# Patient Record
Sex: Female | Born: 1965 | Race: Black or African American | Hispanic: No | Marital: Married | Smoking: Never smoker
Health system: Southern US, Community
[De-identification: ages and names within clinical notes are randomized; demographics above are authoritative.]

## PROBLEM LIST (undated history)

## (undated) DIAGNOSIS — L439 Lichen planus, unspecified: Secondary | ICD-10-CM

## (undated) DIAGNOSIS — F329 Major depressive disorder, single episode, unspecified: Secondary | ICD-10-CM

## (undated) DIAGNOSIS — L709 Acne, unspecified: Secondary | ICD-10-CM

## (undated) DIAGNOSIS — I7300 Raynaud's syndrome without gangrene: Secondary | ICD-10-CM

## (undated) DIAGNOSIS — D259 Leiomyoma of uterus, unspecified: Secondary | ICD-10-CM

## (undated) DIAGNOSIS — F3289 Other specified depressive episodes: Secondary | ICD-10-CM

## (undated) DIAGNOSIS — M2550 Pain in unspecified joint: Secondary | ICD-10-CM

## (undated) DIAGNOSIS — M1990 Unspecified osteoarthritis, unspecified site: Secondary | ICD-10-CM

## (undated) HISTORY — DX: Leiomyoma of uterus, unspecified: D25.9

## (undated) HISTORY — DX: Unspecified osteoarthritis, unspecified site: M19.90

## (undated) HISTORY — DX: Raynaud's syndrome without gangrene: I73.00

## (undated) HISTORY — PX: PELVIC LAPAROSCOPY: SHX162

## (undated) HISTORY — DX: Pain in unspecified joint: M25.50

## (undated) HISTORY — DX: Major depressive disorder, single episode, unspecified: F32.9

## (undated) HISTORY — DX: Lichen planus, unspecified: L43.9

## (undated) HISTORY — DX: Acne, unspecified: L70.9

## (undated) HISTORY — DX: Other specified depressive episodes: F32.89

---

## 1998-12-28 ENCOUNTER — Inpatient Hospital Stay (HOSPITAL_COMMUNITY): Admission: AD | Admit: 1998-12-28 | Discharge: 1998-12-30 | Payer: Self-pay | Admitting: Gynecology

## 1999-01-29 HISTORY — PX: TUBAL LIGATION: SHX77

## 1999-02-08 ENCOUNTER — Ambulatory Visit (HOSPITAL_COMMUNITY): Admission: RE | Admit: 1999-02-08 | Discharge: 1999-02-08 | Payer: Self-pay | Admitting: Obstetrics and Gynecology

## 2001-01-05 ENCOUNTER — Other Ambulatory Visit: Admission: RE | Admit: 2001-01-05 | Discharge: 2001-01-05 | Payer: Self-pay | Admitting: Gynecology

## 2002-01-09 ENCOUNTER — Other Ambulatory Visit: Admission: RE | Admit: 2002-01-09 | Discharge: 2002-01-09 | Payer: Self-pay | Admitting: Gynecology

## 2003-01-17 ENCOUNTER — Other Ambulatory Visit: Admission: RE | Admit: 2003-01-17 | Discharge: 2003-01-17 | Payer: Self-pay | Admitting: Gynecology

## 2004-02-20 ENCOUNTER — Other Ambulatory Visit: Admission: RE | Admit: 2004-02-20 | Discharge: 2004-02-20 | Payer: Self-pay | Admitting: Gynecology

## 2004-05-30 HISTORY — PX: COLONOSCOPY: SHX174

## 2004-07-19 ENCOUNTER — Ambulatory Visit (HOSPITAL_COMMUNITY): Admission: RE | Admit: 2004-07-19 | Discharge: 2004-07-19 | Payer: Self-pay | Admitting: Internal Medicine

## 2005-02-23 ENCOUNTER — Other Ambulatory Visit: Admission: RE | Admit: 2005-02-23 | Discharge: 2005-02-23 | Payer: Self-pay | Admitting: Gynecology

## 2006-01-07 IMAGING — CR DG LUMBAR SPINE COMPLETE 4+V
5 series · 5 of 5 positions shown · non-contrast
Comparison: none

CLINICAL DATA: Low back pain bilaterally for six months.  No trauma.  No prior studies.
 LUMBAR SPINE ? FIVE VIEWS:
 Five views of the lumbar spine demonstrate mild rotoscoliosis of the lumbar spine with convexity to the left.  Disc spaces are maintained.  No fracture or dislocation is noted.  There is mild degenerative change of the facet joints at L4-5 and L5-6.  No spondylolysis or spondylolisthesis is noted.

[view not recorded (1 of 5)]
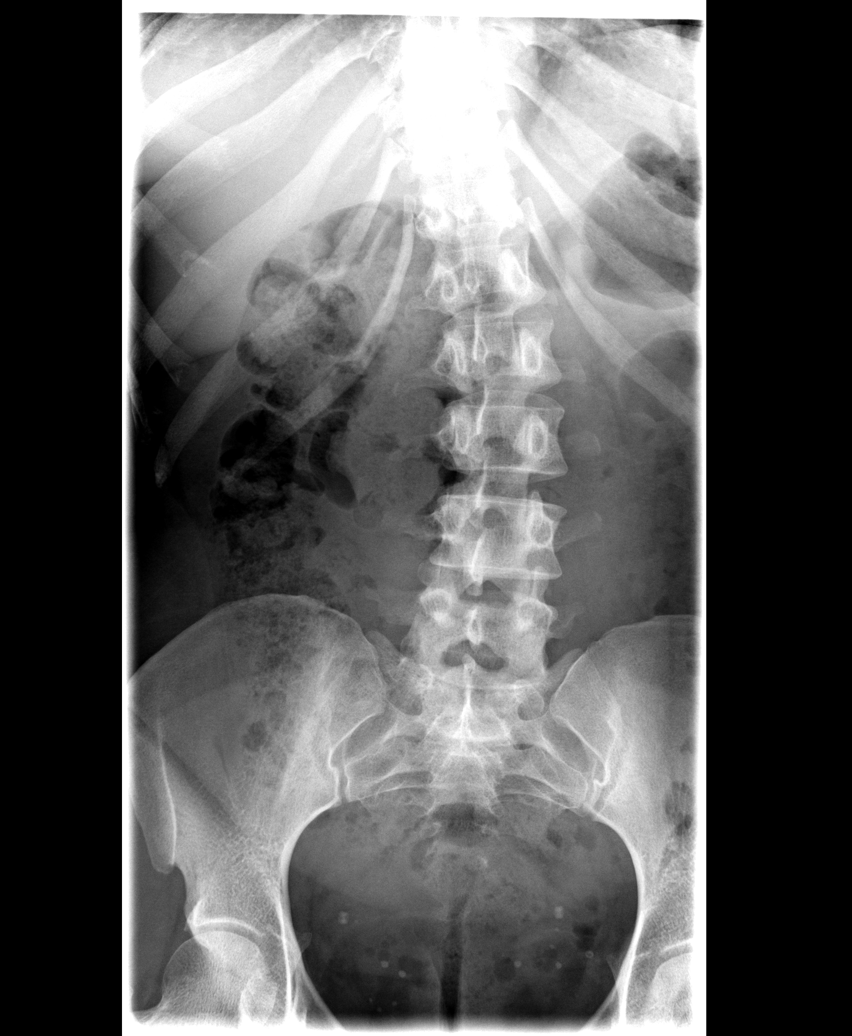

[view not recorded (2 of 5)]
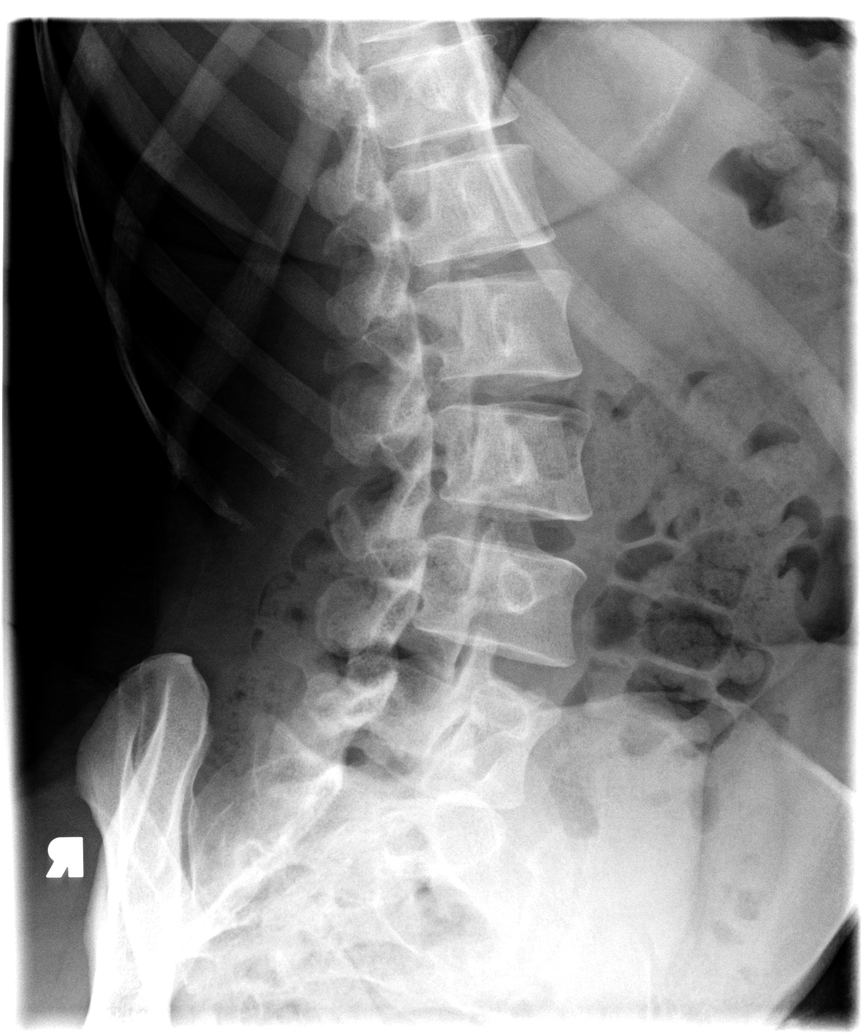

[view not recorded (3 of 5)]
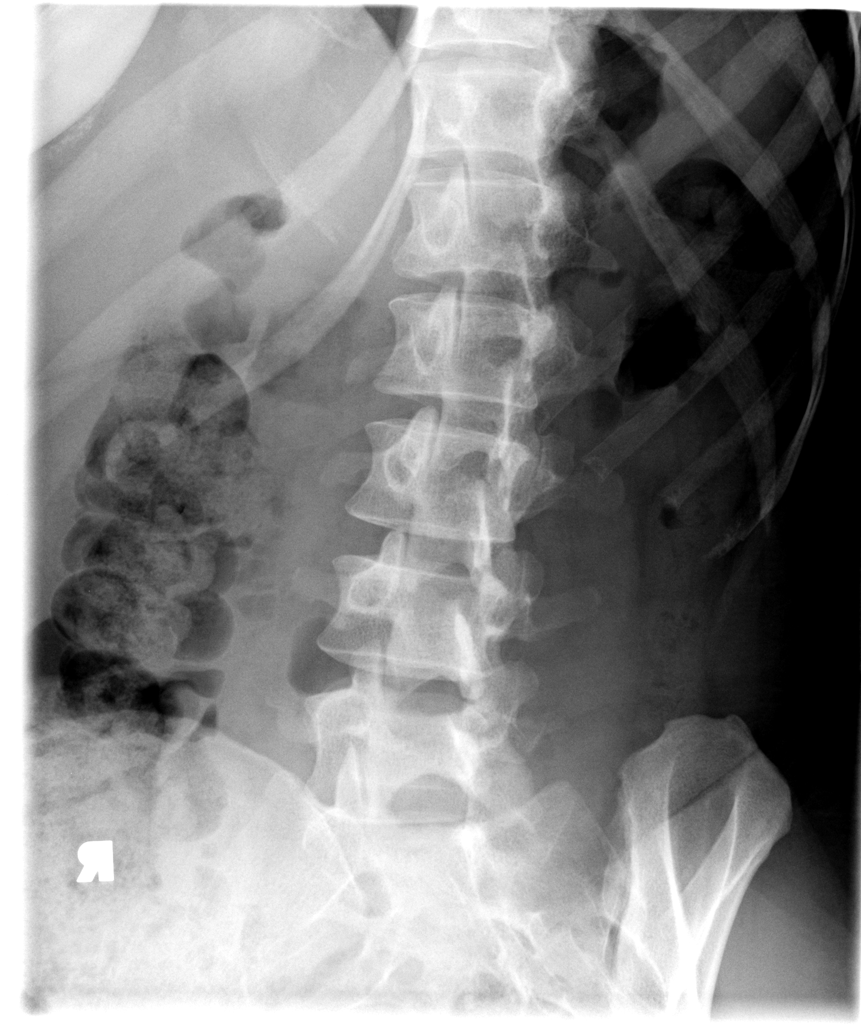

[view not recorded (4 of 5)]
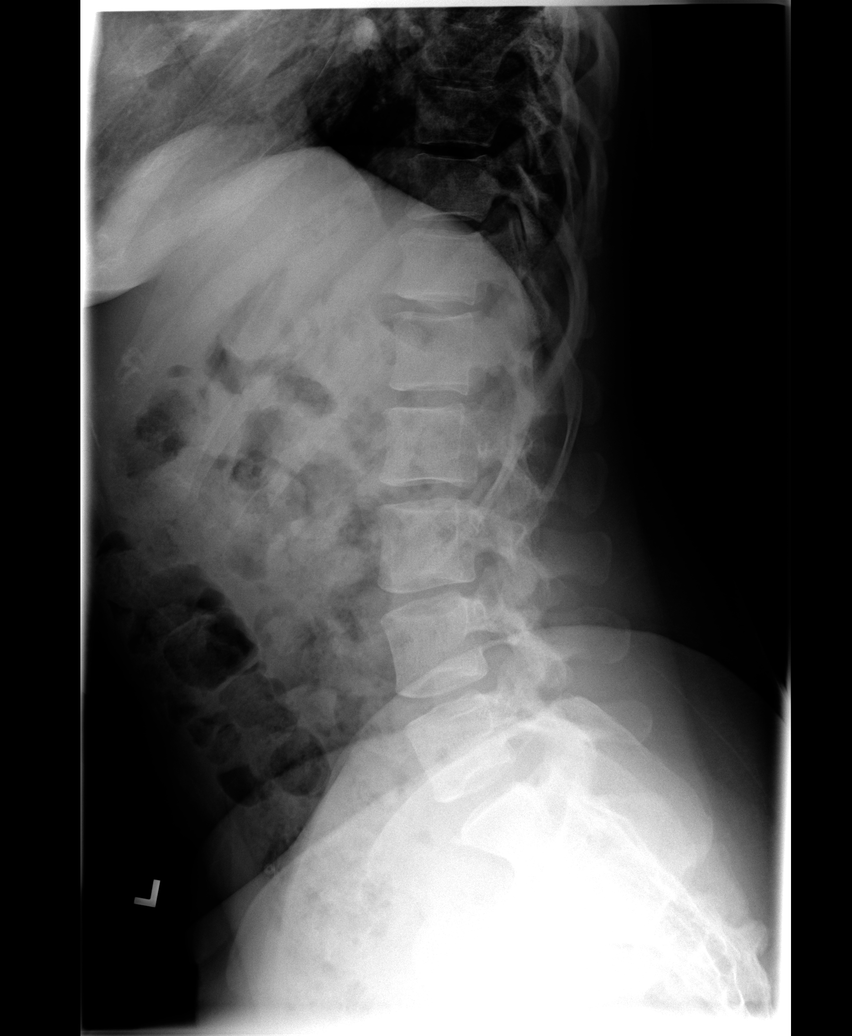

[view not recorded (5 of 5)]
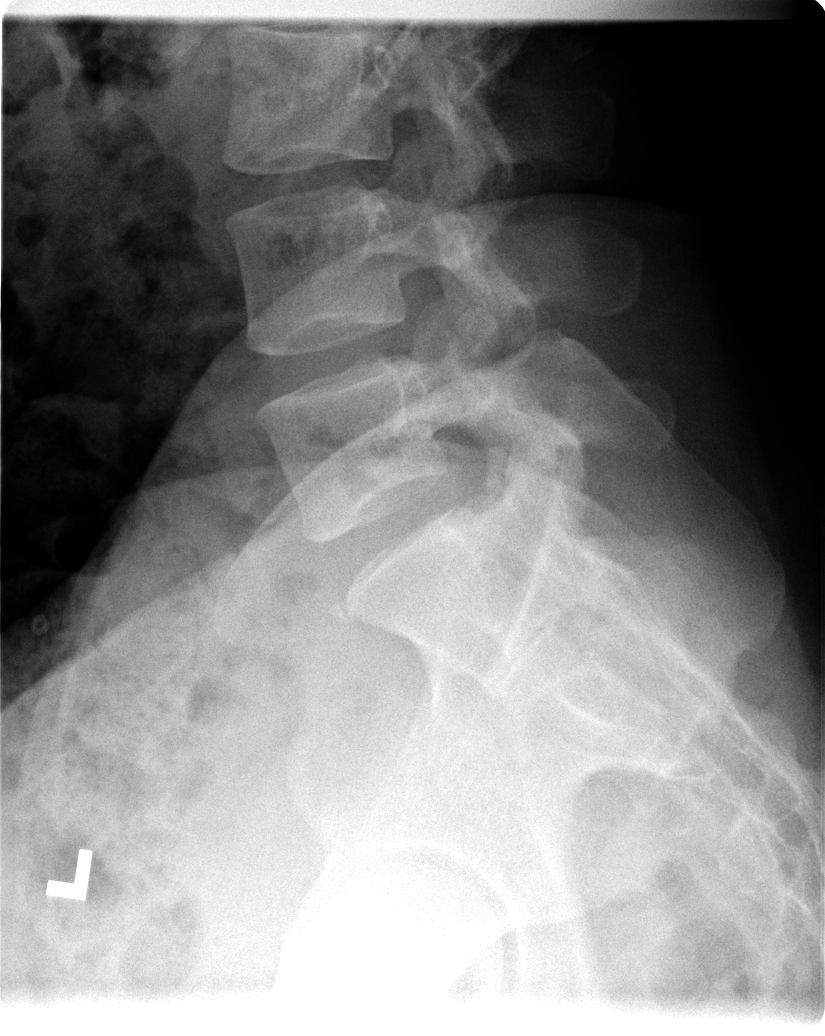

[5 of 5 positions shown; findings below may reference images not displayed]

IMPRESSION: Mild rotoscoliosis of the lumbar spine with convexity to the left.  Mild degenerative changes at the facet joints at L4-5 and L5-S1.

## 2006-03-06 ENCOUNTER — Other Ambulatory Visit: Admission: RE | Admit: 2006-03-06 | Discharge: 2006-03-06 | Payer: Self-pay | Admitting: Gynecology

## 2007-02-10 ENCOUNTER — Emergency Department (HOSPITAL_COMMUNITY): Admission: EM | Admit: 2007-02-10 | Discharge: 2007-02-10 | Payer: Self-pay | Admitting: Emergency Medicine

## 2007-03-12 ENCOUNTER — Other Ambulatory Visit: Admission: RE | Admit: 2007-03-12 | Discharge: 2007-03-12 | Payer: Self-pay | Admitting: Gynecology

## 2007-04-23 ENCOUNTER — Emergency Department (HOSPITAL_COMMUNITY): Admission: EM | Admit: 2007-04-23 | Discharge: 2007-04-23 | Payer: Self-pay | Admitting: Emergency Medicine

## 2008-03-12 ENCOUNTER — Encounter: Payer: Self-pay | Admitting: Women's Health

## 2008-03-12 ENCOUNTER — Other Ambulatory Visit: Admission: RE | Admit: 2008-03-12 | Discharge: 2008-03-12 | Payer: Self-pay | Admitting: Gynecology

## 2008-03-12 ENCOUNTER — Ambulatory Visit: Payer: Self-pay | Admitting: Women's Health

## 2008-09-24 ENCOUNTER — Ambulatory Visit: Payer: Self-pay | Admitting: Women's Health

## 2009-01-28 LAB — HM MAMMOGRAPHY: HM Mammogram: NORMAL

## 2009-03-16 ENCOUNTER — Encounter: Payer: Self-pay | Admitting: Women's Health

## 2009-03-16 ENCOUNTER — Ambulatory Visit: Payer: Self-pay | Admitting: Women's Health

## 2009-03-16 ENCOUNTER — Other Ambulatory Visit: Admission: RE | Admit: 2009-03-16 | Discharge: 2009-03-16 | Payer: Self-pay | Admitting: Gynecology

## 2009-12-28 ENCOUNTER — Ambulatory Visit: Payer: Self-pay | Admitting: Internal Medicine

## 2009-12-28 DIAGNOSIS — L659 Nonscarring hair loss, unspecified: Secondary | ICD-10-CM | POA: Insufficient documentation

## 2009-12-28 DIAGNOSIS — R5381 Other malaise: Secondary | ICD-10-CM | POA: Insufficient documentation

## 2009-12-28 DIAGNOSIS — R5383 Other fatigue: Secondary | ICD-10-CM

## 2009-12-28 DIAGNOSIS — D649 Anemia, unspecified: Secondary | ICD-10-CM | POA: Insufficient documentation

## 2009-12-28 DIAGNOSIS — R519 Headache, unspecified: Secondary | ICD-10-CM | POA: Insufficient documentation

## 2009-12-28 DIAGNOSIS — R51 Headache: Secondary | ICD-10-CM | POA: Insufficient documentation

## 2009-12-28 LAB — CONVERTED CEMR LAB
ALT: 13 units/L (ref 0–35)
Albumin: 3.8 g/dL (ref 3.5–5.2)
BUN: 6 mg/dL (ref 6–23)
Chloride: 104 meq/L (ref 96–112)
Eosinophils Relative: 5.5 % — ABNORMAL HIGH (ref 0.0–5.0)
Glucose, Bld: 84 mg/dL (ref 70–99)
HCT: 32.1 % — ABNORMAL LOW (ref 36.0–46.0)
Lymphs Abs: 2 10*3/uL (ref 0.7–4.0)
MCV: 82.3 fL (ref 78.0–100.0)
Monocytes Absolute: 0.4 10*3/uL (ref 0.1–1.0)
Neutro Abs: 2.1 10*3/uL (ref 1.4–7.7)
Platelets: 369 10*3/uL (ref 150.0–400.0)
Potassium: 4.3 meq/L (ref 3.5–5.1)
RDW: 15.6 % — ABNORMAL HIGH (ref 11.5–14.6)
TSH: 1.87 microintl units/mL (ref 0.35–5.50)
Total Bilirubin: 0.2 mg/dL — ABNORMAL LOW (ref 0.3–1.2)
WBC: 4.7 10*3/uL (ref 4.5–10.5)

## 2010-01-12 ENCOUNTER — Telehealth: Payer: Self-pay | Admitting: Internal Medicine

## 2010-01-29 ENCOUNTER — Telehealth (INDEPENDENT_AMBULATORY_CARE_PROVIDER_SITE_OTHER): Payer: Self-pay | Admitting: *Deleted

## 2010-02-02 ENCOUNTER — Ambulatory Visit: Payer: Self-pay | Admitting: Internal Medicine

## 2010-02-24 ENCOUNTER — Telehealth: Payer: Self-pay | Admitting: Internal Medicine

## 2010-02-24 DIAGNOSIS — L259 Unspecified contact dermatitis, unspecified cause: Secondary | ICD-10-CM | POA: Insufficient documentation

## 2010-03-09 ENCOUNTER — Emergency Department (HOSPITAL_COMMUNITY): Admission: EM | Admit: 2010-03-09 | Discharge: 2010-03-09 | Payer: Self-pay | Admitting: Emergency Medicine

## 2010-03-17 ENCOUNTER — Ambulatory Visit: Payer: Self-pay | Admitting: Women's Health

## 2010-03-17 ENCOUNTER — Other Ambulatory Visit
Admission: RE | Admit: 2010-03-17 | Discharge: 2010-03-17 | Payer: Self-pay | Source: Home / Self Care | Admitting: Gynecology

## 2010-05-25 ENCOUNTER — Telehealth: Payer: Self-pay | Admitting: Internal Medicine

## 2010-06-11 ENCOUNTER — Ambulatory Visit
Admission: RE | Admit: 2010-06-11 | Discharge: 2010-06-11 | Payer: Self-pay | Source: Home / Self Care | Attending: Internal Medicine | Admitting: Internal Medicine

## 2010-06-23 ENCOUNTER — Telehealth: Payer: Self-pay | Admitting: Internal Medicine

## 2010-06-29 NOTE — Progress Notes (Signed)
Summary: Tina Stokes pt/please advise  Phone Note Call from Patient Call back at Home Phone [phone]   Caller: Patient Call For: Newt Lukes MD Summary of Call: Pt left message on triage B..Pt taking medciation prescribed by Dr Tina Stokes for her nerves that has caused acne. She wants to be switched to another med that will not cause this side effect. Please advise. Initial call taken by: Verdell Face,  January 29, 2010 9:02 AM  Follow-up for Phone Call        needs to be seen, none of her meds are associated with acne Follow-up by: Etta Grandchild MD,  January 29, 2010 9:59 AM  Additional Follow-up for Phone Call Additional follow up Details #1::        Pt has appt 9/6 at 1:15pm w/Dr Tina Stokes Additional Follow-up by: Verdell Face,  January 29, 2010 10:38 AM

## 2010-06-29 NOTE — Progress Notes (Signed)
Summary: ALT med  Phone Note Call from Patient Call back at Home Phone [phone]   Caller: Patient Summary of Call: Pt called stating that she is still having facial acne with Citalopram. Pt is requesting alternative to [employer]. Initial call taken by: Margaret Pyle, CMA,  February 24, 2010 10:54 AM  Follow-up for Phone Call        i do not know of citalopram (or venlafaxine as before) causing acne - will refer to derm for opinion on facial skin condition and tx - in meanwhile, would wean off citalopram - reduce to 10mg  once daily (if not already on this dose) x 5 days, then stop med- no new substitiute until seen by derm - thanks Follow-up by: Newt Lukes MD,  February 24, 2010 12:53 PM  Additional Follow-up for Phone Call Additional follow up Details #1::        Pt advised and understood MD recommendation Additional Follow-up by: Margaret Pyle, CMA,  February 24, 2010 1:10 PM  New Problems: CONTACT DERMATITIS&OTHER ECZEMA DUE UNSPEC CAUSE (ICD-692.9)   New Problems: CONTACT DERMATITIS&OTHER ECZEMA DUE UNSPEC CAUSE (ICD-692.9)

## 2010-06-29 NOTE — Progress Notes (Signed)
Summary: ALT med  Phone Note Call from Patient Call back at [facility] Phone [phone]   Caller: Patient Summary of Call: Pt called requesting an alternate medication for "nerves", other than Gabapentin. Pt states she discussed side effects (drowsiness, dizziness, tired feeling) with MD at [facility] and thought medication was going to be changed. Pt is requesting a generic without same SE? Please advise. Initial call taken by: Margaret Pyle, CMA,  January 12, 2010 4:42 PM  Follow-up for Phone Call        will try change to generic effexor (venlafaxine) - erx done - stop gabapentin - needs ROV in next 6-12wk to review symptoms and new med, sooner if probs Follow-up by: Newt Lukes MD,  January 12, 2010 5:03 PM  Additional Follow-up for Phone Call Additional follow up Details #1::        Pt informed in detail via VM. Told to call back with any further questions or concerns. Additional Follow-up by: Margaret Pyle, CMA,  January 13, 2010 8:06 AM    New/Updated Medications: VENLAFAXINE HCL 37.5 MG XR24H-CAP (VENLAFAXINE HCL) 1 by mouth once daily Prescriptions: VENLAFAXINE HCL 37.5 MG XR24H-CAP (VENLAFAXINE HCL) 1 by mouth once daily  #30 x 3   Entered and Authorized by:   Newt Lukes MD   Signed by:   Newt Lukes MD on 01/12/2010   Method used:   Electronically to        [facility] [address] #[id number]* (retail)       [address] [place], [place]  [address]       Ph: [phone]       Fax: [phone]   RxID:   [id number]

## 2010-06-29 NOTE — Assessment & Plan Note (Signed)
Summary: f/u appt re anxiety meds/cd   Vital Signs:  Patient profile:   45 year old female Height:      61 inches (154.94 cm) Weight:      142.4 pounds (64.73 kg) O2 Sat:      98 % on Room air Temp:     98.4 degrees F (36.89 degrees C) oral Pulse rate:   67 / minute BP sitting:   102 / 72  (left arm) Cuff size:   regular  Vitals Entered By: Orlan Leavens RMA (February 02, 2010 1:09 PM)  O2 Flow:  Room air CC: follow-up visit Is Patient Diabetic? No Pain Assessment Patient in pain? no        Primary Care Provider:  Newt Lukes MD  CC:  follow-up visit.  History of Present Illness: here for f/u - anxiety -  tol venlafax well with less calp irritation (primary symptoms) and less fatigue/irritability - caused adv SE: acne - would like change in med - gapapentin ineffective compared to venlafax in controlling scalp symptoms   acne - no recent hx same prior to starting [facility] - affects only face - bilateral jaw region - no change soap or makeup -  Current Medications (verified): 1)  Multivitamins  Tabs (Multiple Vitamin) .... Take 1 By Mouth Once Daily 2)  Folic Acid 1 Mg Tabs (Folic Acid) .... Take 1 By Mouth Once Daily 3)  Venlafaxine Hcl 37.5 Mg Xr24h-Cap (Venlafaxine Hcl) .Marland Kitchen.. 1 By Mouth Once Daily 4)  Ferrous Sulfate 325 (65 Fe) Mg Tbec (Ferrous Sulfate) .... Take 1 Two Times A Day  Allergies (verified): No Known Drug Allergies  Past History:  Past Medical History: Anemia-NOS Depression/anxiety chronic "scalp infection" -     followed at [facility] and [facility] for same - s/p 6 mo oral abx w/o resolution  MD roster: gyn - nancy young  Review of Systems  The patient denies anorexia, fever, syncope, and peripheral edema.    Physical Exam  General:  alert, well-developed, well-nourished, and cooperative to examination.    Lungs:  normal respiratory effort, no intercostal retractions or use of accessory muscles; normal breath sounds bilaterally - no crackles  and no wheezes.    Heart:  normal rate, regular rhythm, no murmur, and no rub. BLE without edema. Skin:  mild-mod cystic acne bilateral jaw region of face R>L - scalp intact - no lesions or rash or hair loss Psych:  Oriented X3, memory intact for recent and remote, normally interactive, good eye contact, not anxious appearing, not depressed appearing, and not agitated.      Impression & Recommendations:  Problem # 1:  DEPRESSION (ICD-311)  improved symptoms anxiety and depression with venlafax - notes less scalp irritation on venlafax compared to gabapentin- (pt's primary concern/manifestation of symptoms) try ssri given adv se acne with venlafax - citalopram erx done f/u 3-6 mos to reiew, sooner if probs or ineffective symptoms relief Her updated medication list for this problem includes:    Citalopram Hydrobromide 10 Mg Tabs (Citalopram hydrobromide) .Marland Kitchen... 1 by mouth once daily (or as directed)  Orders: Prescription Created Electronically [phone])  Complete Medication List: 1)  Multivitamins Tabs (Multiple vitamin) .... Take 1 by mouth once daily 2)  Folic Acid 1 Mg Tabs (Folic acid) .... Take 1 by mouth once daily 3)  Citalopram Hydrobromide 10 Mg Tabs (Citalopram hydrobromide) .Marland Kitchen.. 1 by mouth once daily (or as directed) 4)  Ferrous Sulfate 325 (65 Fe) Mg Tbec (Ferrous sulfate) .... Take  1 two times a day 5)  Benzaclin 1-5 % Gel (Clindamycin phos-benzoyl perox) .... Apply to affected skin two times a day  Patient Instructions: 1)  it was good to see you today. 2)  stop venlafaxine - start low dose citalopram as discussed- 3)  also benzaclin for your skin 4)  your prescriptions have been electronically submitted to your pharmacy. Please take as directed. Contact our office if you believe you're having problems with the medication(s).  5)  Please schedule a follow-up appointment in 3-6 months to review symptoms and medications, call sooner if problems.  Prescriptions: BENZACLIN 1-5  % GEL (CLINDAMYCIN PHOS-BENZOYL PEROX) apply to affected skin two times a day  #1 x 1   Entered by:   Orlan Leavens RMA   Authorized by:   Newt Lukes MD   Signed by:   Orlan Leavens RMA on 02/02/2010   Method used:   Electronically to        [facility] [address] [phone]* (retail)       [address]       [place], [place]  [address]       Ph: [phone]       Fax: [phone]   RxID:   [id number] CITALOPRAM HYDROBROMIDE 10 MG TABS (CITALOPRAM HYDROBROMIDE) 1 by mouth once daily (or as directed)  #30 x 6   Entered by:   Orlan Leavens RMA   Authorized by:   Newt Lukes MD   Signed by:   Orlan Leavens RMA on 02/02/2010   Method used:   Electronically to        [facility] #[id number]* (retail)       [address]       [place], [place]  [address]       Ph: [phone]       Fax: [phone]   RxID:   [id number] BENZACLIN 1-5 % GEL (CLINDAMYCIN PHOS-BENZOYL PEROX) apply to affected skin two times a day  #1 x 1   Entered and Authorized by:   Newt Lukes MD   Signed by:   Newt Lukes MD on 02/02/2010   Method used:   Electronically to        [facility] #[id number]* (retail)       [address] [place], [place]  [address]       Ph: [phone]       Fax: [phone]   RxID:   [id number] CITALOPRAM HYDROBROMIDE 10 MG TABS (CITALOPRAM HYDROBROMIDE) 1 by mouth once daily (or as directed)  #30 x 6   Entered and Authorized by:   Newt Lukes MD   Signed by:   Newt Lukes MD on 02/02/2010   Method used:   Electronically to        [facility] #[id number]* (retail)       [address] [place], [place]  [address]       Ph: [phone]       Fax: [phone]   RxID:   [id number]

## 2010-06-29 NOTE — Assessment & Plan Note (Signed)
Summary: New / [facility] / # / cd   Vital Signs:  Patient profile:   45 year old female Height:      61.0 inches (154.94 cm) Weight:      143.8 pounds (65.36 kg) BMI:     27.27 O2 Sat:      99 % on Room air Temp:     98.7 degrees F (37.06 degrees C) oral Pulse rate:   72 / minute BP sitting:   110 / 70  (left arm) Cuff size:   regular  Vitals Entered By: Orlan Leavens RMA (December 28, 2009 2:00 PM)  O2 Flow:  Room air CC: New patient Is Patient Diabetic? No Pain Assessment Patient in pain? no        Primary Care Provider:  Newt Lukes MD  CC:  New patient.  History of Present Illness: new pt to me and our practice, here to est care -  c/o fatigue - ongoing 2 weeks -  gradual onset similar symptoms with anemia in past - but has never required transfusion - prev rx'd iron tabs - none in past year -  still having regular menstral cycles - not esp heavy or changed denies thalasemia or other hereditary anemia - denies depression/anxiety symptoms or weight changes no CP, SOB or syncope - no med changes or sleep trouble     Preventive Screening-Counseling & Management  Alcohol-Tobacco     Alcohol drinks/day: <1     Alcohol Counseling: not indicated; use of alcohol is not excessive or problematic     Smoking Status: never     Tobacco Counseling: not indicated; no tobacco use  Caffeine-Diet-Exercise     Exercise Counseling: to improve exercise regimen     Depression Counseling: not indicated; screening negative for depression  Safety-Violence-Falls     Seat Belt Counseling: not indicated; patient wears seat belts     Helmet Counseling: not applicable     Firearms in the Home: no firearms in the home     Firearm Counseling: not applicable     Smoke Detectors: yes     Violence in the Home: no risk noted     Fall Risk Counseling: not indicated; no significant falls noted  Clinical Review Panels:  Prevention   Last Mammogram:  No specific  mammographic evidence of malignancy.   (01/28/2009)   Last Pap Smear:  Interpretation Result:Negative for intraepithelial Lesion or Malignancy.    (02/27/2009)   Last Colonoscopy:  Pt states she was in IBS study had colonoscopy everything was normal (05/31/2007)   Current Medications (verified): 1)  Multivitamins  Tabs (Multiple Vitamin) .... Take 1 By Mouth Once Daily 2)  Folic Acid 1 Mg Tabs (Folic Acid) .... Take 1 By Mouth Once Daily 3)  Gabapentin 600 Mg Tabs (Gabapentin) .... Take 1-3 Once Daily As Needed  Allergies (verified): No Known Drug Allergies  Past History:  Past Medical History: Anemia-NOS Depression chronic "scalp infection" -     followed at [facility] and [facility] for same - s/p 6 mo oral abx w/o resolution  Md roster: gyn - nancy young  Past Surgical History: Denies surgical history  Family History: Family History of Arthritis (parent)   Family History High cholesterol( parent) Family History Hypertension (parent)  Social History: Never Smoked, occ alcohol married, lives with spouse and 2 kids works as Associate Professor Smoking Status:  never  Review of Systems       see HPI above. I have reviewed all  other systems and they were negative.   Physical Exam  General:  alert, well-developed, well-nourished, and cooperative to examination.    Eyes:  vision grossly intact; pupils equal, round and reactive to light.  conjunctiva and lids normal.    Ears:  normal pinnae bilaterally, without erythema, swelling, or tenderness to palpation. TMs clear, without effusion, or cerumen impaction. Hearing grossly normal bilaterally  Mouth:  teeth and gums in good repair; mucous membranes moist, without lesions or ulcers. oropharynx clear without exudate, no erythema.  Neck:  supple, full ROM, no masses, no thyromegaly; no thyroid nodules or tenderness. no JVD or carotid bruits.   Lungs:  normal respiratory effort, no intercostal retractions or use of accessory muscles; normal  breath sounds bilaterally - no crackles and no wheezes.    Heart:  normal rate, regular rhythm, no murmur, and no rub. BLE without edema. normal DP pulses and normal cap refill in all 4 extremities    Abdomen:  soft, non-tender, normal bowel sounds, no distention; no masses and no appreciable hepatomegaly or splenomegaly.   Genitalia:  defer to gyn Msk:  No deformity or scoliosis noted of thoracic or lumbar spine.   Neurologic:  alert & oriented X3 and cranial nerves II-XII symetrically intact.  strength normal in all extremities, sensation intact to light touch, and gait normal. speech fluent without dysarthria or aphasia; follows commands with good comprehension.  Skin:  mild bitemporal thinning of hair w/o broken hair or inflammation - no scalp lesions appreciated (female pattern early stages); no excess hair on chin, face, arms - otherwise, no rashes, vesicles, ulcers, or erythema. No nodules or irregularity to palpation.   Psych:  Oriented X3, memory intact for recent and remote, normally interactive, good eye contact, not anxious appearing, not depressed appearing, and not agitated.      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:" for which she is being treated at [facility] at this time eval with labs as above for fatigue - as no other hirsuit changes or neuro/vison problems, will forego hormone eval at this time Orders: TLB-BMP (Basic Metabolic Panel-BMET) (80048-METABOL) TLB-CBC Platelet - w/Differential  (85025-CBCD) TLB-Hepatic/Liver Function Pnl (80076-HEPATIC) TLB-TSH (Thyroid Stimulating Hormone) (84443-TSH)  Problem # 3:  ANEMIA-NOS (ICD-285.9) reports hx same - prev on slowFe - await labs - may need to re-start tx for same Her updated medication list for this problem includes:    Folic Acid 1 Mg Tabs (Folic acid) .Marland Kitchen... Take 1 by mouth once daily  Orders: TLB-BMP (Basic Metabolic Panel-BMET) (80048-METABOL) TLB-CBC Platelet - w/Differential (85025-CBCD) TLB-Hepatic/Liver Function Pnl (80076-HEPATIC) TLB-TSH (Thyroid Stimulating Hormone) (84443-TSH)  Complete Medication List: 1)  Multivitamins Tabs (Multiple vitamin) .... Take 1 by mouth once daily 2)  Folic Acid 1 Mg Tabs (Folic acid) .... Take 1 by mouth once daily 3)  Gabapentin 600 Mg Tabs (Gabapentin) .... Take 1-3 once daily as needed  Patient Instructions: 1)  it was good to see you today. 2)  test(s) ordered today - your results will be called to you in 48-72 hours from the time of test completion; call [phone]  and enter your 9 digit MRN (listed above on this page, just below your name); if any changes need to be made or there are abnormal results, you will be contacted directly.  3)  continue supportive care for your scalp condition as discussed - no changes recommended at this time 4)  stay hydrated and out of the heat of the day - 5)  Please schedule a follow-up appointment as needed, may scheduled for a "medical physical" to include cholesterol screening at your lesiure (continue to see gyn for PAP/pelvic).   Mammogram  Procedure date:  01/28/2009  Findings:      No specific mammographic evidence of malignancy.    Pap Smear  Procedure date:  02/27/2009  Findings:      Interpretation Result:Negative for intraepithelial Lesion or Malignancy.     Colonoscopy  Procedure date:  05/31/2007  Findings:      Pt states she was in IBS study had colonoscopy everything was normal

## 2010-07-01 NOTE — Assessment & Plan Note (Signed)
Summary: rx consult-lb   Vital Signs:  Patient profile:   45 year old female Height:      61 inches (154.94 cm) Weight:      142.0 pounds (64.55 kg) O2 Sat:      99 % on Room air Temp:     98.5 degrees F (36.94 degrees C) oral Pulse rate:   81 / minute BP sitting:   110 / 76  (left arm) Cuff size:   regular  Vitals Entered By: Orlan Leavens RMA (June 11, 2010 8:36 AM)  O2 Flow:  Room air CC: discuss med Is Patient Diabetic? No Pain Assessment Patient in pain? no        Primary Care Provider:  Newt Lukes MD  CC:  discuss med.  History of Present Illness: here for f/u -  anxiety -  prev venlafax trial caused acne changed to citalopram - tol well with less scalp irritation (primary symptoms) and less fatigue/irritability - would like inc dose due to increase stress (son with sz 04/2010) gapapentin ineffective compared to venlafax in controlling scalp symptoms   c/o muscle spasm  affects right neck and left shoulder blade +hx same but not in years exac by stress and position at work [employer]) no trauma or injury no fever  Clinical Review Panels:  CBC   WBC:  4.7 (12/28/2009)   RBC:  3.90 (12/28/2009)   Hgb:  10.8 (12/28/2009)   Hct:  32.1 (12/28/2009)   Platelets:  369.0 (12/28/2009)   MCV  82.3 (12/28/2009)   MCHC  33.5 (12/28/2009)   RDW  15.6 (12/28/2009)   PMN:  44.7 (12/28/2009)   Lymphs:  41.6 (12/28/2009)   Monos:  7.6 (12/28/2009)   Eosinophils:  5.5 (12/28/2009)   Basophil:  0.6 (12/28/2009)  Complete Metabolic Panel   Glucose:  84 (12/28/2009)   Sodium:  140 (12/28/2009)   Potassium:  4.3 (12/28/2009)   Chloride:  104 (12/28/2009)   CO2:  29 (12/28/2009)   BUN:  6 (12/28/2009)   Creatinine:  0.9 (12/28/2009)   Albumin:  3.8 (12/28/2009)   Total Protein:  6.8 (12/28/2009)   Calcium:  9.2 (12/28/2009)   Total Bili:  0.2 (12/28/2009)   Alk Phos:  42 (12/28/2009)   SGPT (ALT):  13 (12/28/2009)   SGOT (AST):  17  (12/28/2009)   Current Medications (verified): 1)  Citalopram Hydrobromide 10 Mg Tabs (Citalopram Hydrobromide) .Marland Kitchen.. 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    Physical Exam  General:  alert, well-developed, well-nourished, and cooperative to examination.    Lungs:  normal respiratory effort, no intercostal retractions or use of accessory muscles; normal breath sounds bilaterally - no crackles and no wheezes.    Heart:  normal rate, regular rhythm, no murmur, and no rub. BLE without edema. Msk:  +myofascial pain due to spasm over right paravert muscles and L scapular region Neurologic:  alert & oriented X3 and cranial nerves II-XII symetrically intact.  strength normal in all extremities, sensation intact to light touch, and gait normal. speech fluent without dysarthria or aphasia; follows commands with good comprehension.  Skin:  uniformly broken  hairs over crown of head without scalp inflammation or lesion Psych:  Oriented X3, memory intact for recent and remote, normally interactive, good eye contact, not anxious appearing, min depressed appearing, and not agitated.      Impression & Recommendations:  Problem # 1:  DEPRESSION (ICD-311)  Her updated medication list for this problem includes:    Citalopram Hydrobromide 20 Mg Tabs (Citalopram hydrobromide) .Marland Kitchen... 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 1 by mouth once daily 2)  Flexeril 10 Mg Tabs (Cyclobenzaprine hcl) .... 1/2-1 by mouth three times a day as needed for muscle spasm  Patient Instructions: 1)  it was good to see you today. 2)  increase citalopram to 20mg  once daily for stress control 3)  use flexeril formuscle spasm pain 4)  your prescriptions have been electronically submitted to your pharmacy. Please take as directed. Contact our office if you believe you're having problems with the medication(s).  5)  also use aleve 1 tab two times a day x 3 days, then as needed for pain 6)  Please schedule a follow-up appointment in 6 months to review stress and hair loss, call sooner if problems.  Prescriptions: FLEXERIL 10 MG TABS (CYCLOBENZAPRINE HCL) 1/2-1 by mouth three times a day as needed for muscle spasm  #40 x 0   Entered and Authorized by:   Newt Lukes MD   Signed by:   Newt Lukes MD on 06/11/2010   Method used:   Electronically to        [facility] #[id number]* (retail)       [address]       [place], [place]  [address]       Ph: [phone]       Fax: [phone]   RxID:   [id number] CITALOPRAM HYDROBROMIDE 20 MG TABS (CITALOPRAM HYDROBROMIDE) 1 by mouth once daily  #30 x 6   Entered and Authorized by:   Newt Lukes MD   Signed by:   Newt Lukes MD on 06/11/2010   Method used:   Electronically to        [facility] [address] #[id number]* (retail)       [address]       [place],  [place]  [address]       Ph: [phone]       Fax: [phone]   RxID:   [id number]    Orders Added: 1)  Est. Patient Level IV [57322] 2)  Prescription Created Electronically [phone]

## 2010-07-01 NOTE — Progress Notes (Signed)
Summary: Dosage increase?  Phone Note Call from Patient Call back at Home Phone [phone]   Caller: Patient Summary of Call: Pt called stating she was advised at last OV that if she felt an increase of Citalopram was needed to call and inform MD. Pt states med does not work as well as it used to and would like to know if she can increase dosage freqency, please advise. Initial call taken by: Margaret Pyle, CMA,  May 25, 2010 11:30 AM  Follow-up for Phone Call        it has been >12 weeks since last OV and we need to review the medications and symptoms and SE in person - please sched OV before making and med/dose change - thanks Follow-up by: Newt Lukes MD,  May 25, 2010 12:25 PM  Additional Follow-up for Phone Call Additional follow up Details #1::        left detailed vm for pt on hm # Additional Follow-up by: Lamar Sprinkles, CMA,  May 25, 2010 5:59 PM

## 2010-07-01 NOTE — Progress Notes (Signed)
Summary: ALT med?  Phone Note Call from Patient Call back at [facility] Phone [phone]   Caller: Patient Summary of Call: Pt called stating since increasing antidepressant dose, she has began noticing slowed speech add mild stutter. Pt is requesting MD advisement and alternate medication, please advise. Initial call taken by: Margaret Pyle, CMA,  June 23, 2010 4:46 PM  Follow-up for Phone Call        break citalopram in half and go back to only 10mg  once daily - no prior adv SE on low dose - if cont problems on 10mg  dose, stop med and sched ROV to review other tx options - thx - please note SE (slow speech) may also be due to flexeril rather than antidepressant med -  Follow-up by: Newt Lukes MD,  June 23, 2010 5:02 PM  Additional Follow-up for Phone Call Additional follow up Details #1::        Pt advised and decided to stop Flexeril first and monitor SE Additional Follow-up by: Margaret Pyle, CMA,  June 24, 2010 1:17 PM    New/Updated Medications: CITALOPRAM HYDROBROMIDE 20 MG TABS (CITALOPRAM HYDROBROMIDE) 1/2 by mouth once daily x 2 weeks then inc to 1 by mouth once daily (or as directed)

## 2011-01-03 ENCOUNTER — Encounter: Payer: Self-pay | Admitting: Women's Health

## 2011-01-03 ENCOUNTER — Ambulatory Visit (INDEPENDENT_AMBULATORY_CARE_PROVIDER_SITE_OTHER): Payer: PRIVATE HEALTH INSURANCE | Admitting: Women's Health

## 2011-01-03 VITALS — BP 110/70

## 2011-01-03 DIAGNOSIS — R5381 Other malaise: Secondary | ICD-10-CM

## 2011-01-03 DIAGNOSIS — R42 Dizziness and giddiness: Secondary | ICD-10-CM

## 2011-01-03 DIAGNOSIS — D649 Anemia, unspecified: Secondary | ICD-10-CM

## 2011-01-03 NOTE — Progress Notes (Signed)
  Presents with a complaint of some dizziness, denies any fainting, states feels more fatigued and weak than her normal. She's had a medication change. She is on Celexa 10 mg for anxiety and doing well with that per her primary care. Had some anemia in the past, her last H&H in 2000  in our office was 12.6 and 38.1. She is having regular monthly cycle, and has had a BTL. Will check a CBC and a TSH. Will increase iron rich foods, multivitamin with iron in it was encouraged. States iron supplements cause nausea. CBC shows a 11.8/35.6 H&H. Reviewed with patient, will start on an increased iron rich diet, a few samples of Integra plus were given. Will then start on a woman's One-A-Day with iron daily after finishes samples. Reviewed it is slightly low, but will probably feel better if H/H would increase. Return to office if symptoms do not improve or worsen.

## 2011-01-06 ENCOUNTER — Telehealth: Payer: Self-pay | Admitting: Women's Health

## 2011-01-06 NOTE — Telephone Encounter (Signed)
Left message on cell phone that her TSH was in the normal range at 1.4.

## 2011-01-11 ENCOUNTER — Encounter: Payer: Self-pay | Admitting: Internal Medicine

## 2011-02-21 ENCOUNTER — Ambulatory Visit (INDEPENDENT_AMBULATORY_CARE_PROVIDER_SITE_OTHER): Payer: PRIVATE HEALTH INSURANCE | Admitting: Internal Medicine

## 2011-02-21 ENCOUNTER — Encounter: Payer: Self-pay | Admitting: Internal Medicine

## 2011-02-21 DIAGNOSIS — F329 Major depressive disorder, single episode, unspecified: Secondary | ICD-10-CM

## 2011-02-21 DIAGNOSIS — F3289 Other specified depressive episodes: Secondary | ICD-10-CM

## 2011-02-21 DIAGNOSIS — L299 Pruritus, unspecified: Secondary | ICD-10-CM

## 2011-02-21 MED ORDER — CITALOPRAM HYDROBROMIDE 10 MG PO TABS: 15.0000 mg | ORAL_TABLET | Freq: Every day | ORAL | Status: DC

## 2011-02-21 NOTE — Assessment & Plan Note (Signed)
Overlap with anxiety and manifest with "scalp itch" when flared Multiple prior derm evals including [facility] for same and felt to be psychogenic - Prior tx venlafex by gyn not tolerated - on citalopram for same, low dose Exam benign - slowly uptitrate SSRI for same (tried 05/2010 and quick increase caused side effects)

## 2011-02-21 NOTE — Progress Notes (Signed)
  Subjective:    Patient ID: Tina Stokes, female    DOB: [birth year], 45 y.o.   MRN: [id number]  HPI  here for follow up -   Anxiety/depression -  Manifests with "scalp itch" prev venlafax trial caused acne in 2010 changed to citalopram - tol well with less scalp irritation (primary symptoms) and less fatigue/irritability -  would like inc dose due to increase stress (son with sz 04/2010) - trial 05/2010 increase caused stuttering but ready to try again gabapentin also ineffective in controlling/relieving scalp symptoms   complains of cough - dry x 2 weeks Initially URI symptoms - no fever, sore throat or sputum  Past Medical History  Diagnosis Date  . ANEMIA-NOS   . CONTACT DERMATITIS&OTHER ECZEMA DUE UNSPEC CAUSE   . DEPRESSION      Review of Systems Constitutional: Negative for fever.  Cardiovascular: Negative for chest pain.  Gastrointestinal: Negative for abdominal pain.  Skin: Negative for rash.     Objective:   Physical Exam BP 112/82  Pulse 61  Temp(Src) 98.3 F (36.8 C) (Oral)  Ht 5\' 2"  (1.575 m)  Wt 142 lb 9.6 oz (64.683 kg)  BMI 26.08 kg/m2  SpO2 98% Constitutional: She is well-developed and well-nourished. No distress.  HENT: Head: Normocephalic and atraumatic. Ears: B TMs ok, no erythema or effusion; Nose: Nose normal.  Mouth/Throat: Oropharynx is clear and moist. No oropharyngeal exudate.  Eyes: Conjunctivae and EOM are normal. Pupils are equal, round, and reactive to light. No scleral icterus.  Neck: Normal range of motion. Neck supple. No JVD present. No thyromegaly present.  Cardiovascular: Normal rate, regular rhythm and normal heart sounds.  No murmur heard. No BLE edema. Pulmonary/Chest: Effort normal and breath sounds normal. No respiratory distress. She has no wheezes..  Skin: Skin is warm and dry. No rash or alopecia noted. No erythema.  Psychiatric: She has a normal mood and affect. Her behavior is normal. Judgment and thought content  normal.   Lab Results  Component Value Date   WBC 4.7 12/28/2009   HGB 10.8* 12/28/2009   HCT 32.1* 12/28/2009   PLT 369.0 12/28/2009   ALT 13 12/28/2009   AST 17 12/28/2009   NA 140 12/28/2009   K 4.3 12/28/2009   CL 104 12/28/2009   CREATININE 0.9 12/28/2009   BUN 6 12/28/2009   CO2 29 12/28/2009   TSH 1.87 12/28/2009       Assessment & Plan:  See problem list. Medications and labs reviewed today.

## 2011-02-21 NOTE — Patient Instructions (Signed)
It was good to see you today. Slowly increase citalopram as discussed - 15mg  daily x 2 weeks, then increase to 20mg  daily if no side effects - call for 20mg  prescription if tolerating the 20 mg dose for >2 weeks without side effects  Please schedule followup in 6 months to review symptoms and medications, call sooner if problems.

## 2011-03-21 ENCOUNTER — Other Ambulatory Visit (HOSPITAL_COMMUNITY)
Admission: RE | Admit: 2011-03-21 | Discharge: 2011-03-21 | Disposition: A | Discharge status: Home / Self Care | Payer: PRIVATE HEALTH INSURANCE | Source: Ambulatory Visit | Attending: Women's Health | Admitting: Women's Health

## 2011-03-21 ENCOUNTER — Encounter: Payer: Self-pay | Admitting: Women's Health

## 2011-03-21 ENCOUNTER — Ambulatory Visit (INDEPENDENT_AMBULATORY_CARE_PROVIDER_SITE_OTHER): Payer: PRIVATE HEALTH INSURANCE | Admitting: Women's Health

## 2011-03-21 VITALS — BP 120/70 | Ht 61.0 in | Wt 143.0 lb

## 2011-03-21 DIAGNOSIS — F411 Generalized anxiety disorder: Secondary | ICD-10-CM

## 2011-03-21 DIAGNOSIS — F419 Anxiety disorder, unspecified: Secondary | ICD-10-CM

## 2011-03-21 DIAGNOSIS — Z01419 Encounter for gynecological examination (general) (routine) without abnormal findings: Secondary | ICD-10-CM

## 2011-03-21 DIAGNOSIS — L709 Acne, unspecified: Secondary | ICD-10-CM | POA: Insufficient documentation

## 2011-03-21 MED ORDER — CITALOPRAM HYDROBROMIDE 10 MG PO TABS: 15.0000 mg | ORAL_TABLET | Freq: Every day | ORAL | Status: DC

## 2011-03-21 NOTE — Progress Notes (Signed)
Tina Stokes [birth year] [id number]    History:    The patient presents for annual exam.  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 Gardasil reviewed for her children.   Past medical history, past surgical history, family history and social history were all reviewed and documented in the EPIC chart.   ROS:  A  ROS was performed and pertinent positives and negatives are included in the history.  Exam:  Filed Vitals:   03/21/11 0950  BP: 120/70    General appearance:  Normal Head/Neck:  Normal, without cervical or supraclavicular adenopathy. Thyroid:  Symmetrical, normal in size, without palpable masses or nodularity. Respiratory  Effort:  Normal  Auscultation:  Clear without wheezing or rhonchi Cardiovascular  Auscultation:  Regular rate, without rubs, murmurs or gallops  Edema/varicosities:  Not grossly evident Abdominal  Soft,nontender, without masses, guarding or rebound.  Liver/spleen:  No organomegaly noted  Hernia:  None appreciated  Skin  Inspection:  Grossly normal  Palpation:  Grossly normal Neurologic/psychiatric  Orientation:  Normal with appropriate conversation.  Mood/affect:  Normal  Genitourinary    Breasts: Examined lying and sitting.     Right: Without masses, retractions, discharge or axillary adenopathy.     Left: Without masses, retractions, discharge or axillary adenopathy.   Inguinal/mons:  Normal without inguinal adenopathy  External genitalia:  Normal  BUS/Urethra/Skene's glands:  Normal  Bladder:  Normal  Vagina:  Normal  Cervix:  Normal  Uterus:   normal in size, shape and contour.  Midline and mobile  Adnexa/parametria:     Rt: Without masses or tenderness.   Lt: Without masses or tenderness.  Anus and perineum: Normal  Digital rectal exam: Normal sphincter tone without palpated masses or tenderness  Assessment/Plan:  45 y.o. MBF G3P2 for annual exam monthly 4 day cycles/BTL. Has had numerous problems with  a scalp problem is currently seeing a dermatologist and firm and is hopeful treatment will be effective. Has had numerous labs have all been normal.  Normal GYN exam  Plan: Pap only, SBEs encouraged annual mammogram which have been normal and due in December. Menopause reviewed, is having occasional hot flushes but continues with her monthly cycle. Encouraged exercise, cutting calories for weight loss has gained a few pounds. Vitamin D 1000 daily and calcium rich diet encouraged.   Harrington Challenger [facility], 11:46 AM 03/21/2011

## 2011-04-08 ENCOUNTER — Telehealth: Payer: Self-pay

## 2011-04-08 DIAGNOSIS — F419 Anxiety disorder, unspecified: Secondary | ICD-10-CM

## 2011-04-08 MED ORDER — CITALOPRAM HYDROBROMIDE 20 MG PO TABS
20.0000 mg | ORAL_TABLET | Freq: Every day | ORAL | Status: DC
Start: 2011-04-08 — End: 2012-06-22

## 2011-04-08 NOTE — Telephone Encounter (Signed)
Yes thanks 

## 2011-04-08 NOTE — Telephone Encounter (Signed)
Pt called stating Celexa is working well at increased dose of 20 mg. Pt is requesting updated Rx to pharmacy, okay to Rx?

## 2011-05-09 ENCOUNTER — Encounter: Payer: Self-pay | Admitting: Internal Medicine

## 2011-05-12 ENCOUNTER — Encounter: Payer: Self-pay | Admitting: Women's Health

## 2011-06-13 ENCOUNTER — Telehealth: Payer: Self-pay

## 2011-06-13 NOTE — Telephone Encounter (Signed)
Pt advised and will further discuss her concerns with dermatologist

## 2011-06-13 NOTE — Telephone Encounter (Signed)
I do not know what a heme could offer for this - maybe 2nd derm opinion? - will refer to new derm if ok with pt

## 2011-06-13 NOTE — Telephone Encounter (Signed)
Pt informed of MD's recommendation via VM

## 2011-06-13 NOTE — Telephone Encounter (Signed)
Pt called requesting referral to Hematologist. Pt is concerned that although she is seening a Dermatologist she is still having persistent itching and inflammation of the scalp.

## 2011-07-25 ENCOUNTER — Encounter: Payer: Self-pay | Admitting: Women's Health

## 2011-07-25 ENCOUNTER — Ambulatory Visit (INDEPENDENT_AMBULATORY_CARE_PROVIDER_SITE_OTHER): Payer: PRIVATE HEALTH INSURANCE | Admitting: Women's Health

## 2011-07-25 DIAGNOSIS — N898 Other specified noninflammatory disorders of vagina: Secondary | ICD-10-CM

## 2011-07-25 LAB — WET PREP FOR TRICH, YEAST, CLUE: Trich, Wet Prep: NONE SEEN

## 2011-07-25 MED ORDER — FLUCONAZOLE 150 MG PO TABS: 150.0000 mg | ORAL_TABLET | Freq: Once | ORAL | Status: AC

## 2011-07-25 MED ORDER — TERCONAZOLE 0.8 % VA CREA: 1.0000 | TOPICAL_CREAM | Freq: Every day | VAGINAL | Status: AC

## 2011-07-25 NOTE — Progress Notes (Signed)
Patient ID: Tina Stokes, female   DOB: 1965/08/19, 46 y.o.   MRN: [id number] Presents with the complaint of vaginal discharge with itching. Used over-the-counter Monistat without much relief. Denies any urinary symptoms. History of BTL, monthly cycles.  Exam: External genitalia erythematous at introitus, speculum exam scant white discharge, vaginal walls also erythematous. Wet prep positive for yeast. Bimanual no CMT or adnexal fullness or tenderness.  Yeast vaginitis  Plan: Diflucan 150 by mouth x1 dose and Terazol 3 cream externally when necessary for itching. Patient requested both pill and cream. Instructed to call if no relief of symptoms.

## 2011-07-27 ENCOUNTER — Telehealth: Payer: Self-pay | Admitting: *Deleted

## 2011-07-27 MED ORDER — FLUCONAZOLE 150 MG PO TABS: 150.0000 mg | ORAL_TABLET | Freq: Once | ORAL | Status: AC

## 2011-07-27 NOTE — Telephone Encounter (Signed)
Pt informed with the below note, rx sent to pharmacy. 

## 2011-07-27 NOTE — Telephone Encounter (Signed)
Pt was seen on 07/25/11 for vaginal discharge and giving diflucan 150 mg and Terazol 3 day cream. Pt called back today stating she finished both rx and still has itching. Pt wanted to know if she could have the brand diflucan because she doesn't think that the generic brand is working? Please advise

## 2011-07-27 NOTE — Telephone Encounter (Signed)
Yes please call in Branded diflucan 150  #1 with refill.

## 2012-02-06 ENCOUNTER — Telehealth: Payer: Self-pay | Admitting: Women's Health

## 2012-02-06 ENCOUNTER — Other Ambulatory Visit: Payer: Self-pay | Admitting: Women's Health

## 2012-02-06 MED ORDER — ALPRAZOLAM 0.25 MG PO TABS: 0.2500 mg | ORAL_TABLET | Freq: Three times a day (TID) | ORAL | Status: DC

## 2012-02-06 NOTE — Telephone Encounter (Signed)
Rx called in 

## 2012-02-06 NOTE — Addendum Note (Signed)
Addended by: Harrington Challenger on: 02/06/2012 04:53 PM   Modules accepted: Orders

## 2012-02-06 NOTE — Telephone Encounter (Signed)
Message left

## 2012-02-06 NOTE — Telephone Encounter (Signed)
Patient called requesting refill on "nerve pills" that she said [place] gave her for scalp itching last year.  I see the info regarding scalp itching and dermatology referral at Oct CE last year but not RX.  Patient said she has no insurance right now and cannot afford office visit. Please advise.

## 2012-02-06 NOTE — Telephone Encounter (Signed)
Telephone call, states has been on Xanax 0.25 for itching especially at bedtime in the past. Prescription to be called in.    Victorino Dike -  please call in Xanax 0.25 every 8 hours when necessary #30 with 1 refill.

## 2012-03-21 ENCOUNTER — Other Ambulatory Visit: Payer: Self-pay | Admitting: Internal Medicine

## 2012-03-21 MED ORDER — CEFUROXIME AXETIL 250 MG PO TABS: 250.0000 mg | ORAL_TABLET | Freq: Two times a day (BID) | ORAL | Status: DC

## 2012-03-21 NOTE — Telephone Encounter (Signed)
Ok for ceftin bid x 1 week -  please send erx to appropriate pharmacy - thanks

## 2012-03-21 NOTE — Telephone Encounter (Signed)
Caller: Nakeda/Patient; Patient Name: Christean Leaf; PCP: Rene Paci (Adults only); Best Callback Phone Number: [phone]. Caller relates she has bacterial infection on scalp; itching interferes with sleep- ongoing for approx 7 years.  problems.  Per Hair Loss protocol, see in 24 hours.   She asks if she can get Rx for a new medication; relates she cannot afford office visit as her spouse is out of work   Requests Rx for Cefuroxime Axetil 200 mg.  She states she has a cousin who has similar problems and this is what worked for a cousin who had similar problems.  Per Hair Loss protocol, see in 24 hours.

## 2012-03-21 NOTE — Telephone Encounter (Signed)
Rx sent, pt informed. 

## 2012-03-25 ENCOUNTER — Other Ambulatory Visit: Payer: Self-pay | Admitting: Internal Medicine

## 2012-03-28 ENCOUNTER — Telehealth: Payer: Self-pay | Admitting: Internal Medicine

## 2012-03-28 MED ORDER — CEFUROXIME AXETIL 250 MG PO TABS: 250.0000 mg | ORAL_TABLET | Freq: Two times a day (BID) | ORAL | Status: DC

## 2012-03-28 NOTE — Telephone Encounter (Signed)
1 week abx sent 10/23 - Will send one additional week -  If still unimproved, needs OV thanks

## 2012-03-28 NOTE — Telephone Encounter (Signed)
Caller: Kim/Patient; Patient Name: Tina Stokes; PCP: Rene Paci (Adults only); Best Callback Phone Number: [phone]. Onset  Pt calling for a refill for the cefUROXime.  Itching decreased with treatment. Pt requesting another round of antibiotic hoping this will eliminated the itching.  All emergent symptoms ruled out per Skin Lesions protocol with exception to 'Generalized skin iching without other symtpoms'.  See Provider in 2 wks.  Home care advice given.   PLEASE FOLLOW UP WITH PT/PHARMACY REGARDING SCRIPT FOR cefUROXime. Thank you.

## 2012-03-28 NOTE — Telephone Encounter (Signed)
Pt informed of rx and of MD's advisement for F/U OV without improvement.

## 2012-04-09 ENCOUNTER — Other Ambulatory Visit: Payer: Self-pay | Admitting: *Deleted

## 2012-04-09 MED ORDER — ALPRAZOLAM 0.25 MG PO TABS: 0.2500 mg | ORAL_TABLET | Freq: Three times a day (TID) | ORAL | Status: AC

## 2012-05-03 ENCOUNTER — Telehealth: Payer: Self-pay | Admitting: Internal Medicine

## 2012-05-03 NOTE — Telephone Encounter (Signed)
Patient Information:  Caller Name: Ryker  Phone: [phone]  Patient: Tina Stokes, Tina Stokes  Gender: Female  DOB: [birth year]  Age: 45 Years  PCP: Rene Paci (Adults only)  Pregnant: No   Symptoms  Reason For Call & Symptoms: Scalp itching and Burning  Reviewed Health History In EMR: Yes  Reviewed Medications In EMR: Yes  Reviewed Allergies In EMR: Yes  Reviewed Surgeries / Procedures: Yes  Date of Onset of Symptoms: Unknown  Treatments Tried: Antibiotics, Lastly Ceftin  Treatments Tried Worked: No OB:  LMP: 04/12/2012  Guideline(s) Used:  Itching - Localized  Disposition Per Guideline:   See Within 3 Days in Office  Reason For Disposition Reached:   MODERATE-SEVERE local itching (i.e., interferes with work, school, activities) and not improved after 24 hours of hydrocortisone cream  Advice Given:   Try not to scratch.  Itching is often worsened by scratching (the "Itch-Scratch" cycle).  Cut the fingernails short. (Reason: prevent secondary bacterial infection.)  Ice Cube:  For flare-ups of itching, rub the area with a small ice cube for 5 minutes.  Hydrocortisone Cream For Itching:  Apply hydrocortisone cream 4 times a day. Use it for a couple days, until the itch is mild.  Available OTC in [place] as 0.5% and 1% cream.  Available OTC in [place] as 0.5% cream.  Oral Antihistamine Medication for Itching:  Take an antihistamine by mouth to reduce the itching. Diphenhydramine (Benadryl) is available over-the-counter. Adult dose is 25-50 mg. Take it up to 4 times a day.  An over-the-counter antihistamine that causes less sleepiness is loratadine (e.g., Alavert or Claritin).  Call Back If:  Looks infected (e.g., spreading redness, red streak, pus)  Itching becomes severe  Itching lasts over 7 days  You become worse.  Office Follow Up:  Does the office need to follow up with this patient?: Yes  Instructions For The Office: Pt. asking for Ceftin 500mg  BID  RN  Note:  Pt. is requesting a second round of Ceftin, but wants to take 500mg  bid instead of 250mg  bid.  She states that her scalp improved, but wants to try a higer dosage to help clear up her scalp itching.  db/CAN

## 2012-05-04 NOTE — Telephone Encounter (Signed)
Pt is aware to schedule an appt.  States she will call back.

## 2012-05-04 NOTE — Telephone Encounter (Signed)
OV to further eval prior to additional tx- last OV here >57mo - thanks

## 2012-05-11 ENCOUNTER — Other Ambulatory Visit: Payer: Self-pay | Admitting: Women's Health

## 2012-05-11 DIAGNOSIS — Z1231 Encounter for screening mammogram for malignant neoplasm of breast: Secondary | ICD-10-CM

## 2012-05-15 ENCOUNTER — Telehealth: Payer: Self-pay | Admitting: *Deleted

## 2012-05-15 NOTE — Telephone Encounter (Signed)
(  pt aware you are out of the office) Pt calling requesting Xanax 0.25 mg refill. Pt has no insurance now will have insurance in Jan. Annual overdue. Please advise

## 2012-05-16 MED ORDER — ALPRAZOLAM 0.25 MG PO TABS: 0.2500 mg | ORAL_TABLET | Freq: Every evening | ORAL | Status: DC | PRN

## 2012-05-16 NOTE — Telephone Encounter (Signed)
rx called in, pt informed as well 

## 2012-05-16 NOTE — Telephone Encounter (Signed)
Please call in for patient to next 0.25 every 8 hours when necessary #30 with1 refill.

## 2012-06-04 ENCOUNTER — Ambulatory Visit (HOSPITAL_COMMUNITY)
Admission: RE | Admit: 2012-06-04 | Discharge: 2012-06-04 | Disposition: A | Discharge status: Home / Self Care | Payer: Self-pay | Source: Ambulatory Visit | Attending: Women's Health | Admitting: Women's Health

## 2012-06-04 DIAGNOSIS — Z1231 Encounter for screening mammogram for malignant neoplasm of breast: Secondary | ICD-10-CM

## 2012-06-22 ENCOUNTER — Encounter: Payer: Self-pay | Admitting: Internal Medicine

## 2012-06-22 ENCOUNTER — Ambulatory Visit (INDEPENDENT_AMBULATORY_CARE_PROVIDER_SITE_OTHER): Payer: BC Managed Care – PPO | Admitting: Internal Medicine

## 2012-06-22 VITALS — BP 120/82 | HR 69 | Temp 97.5°F | Wt 151.8 lb

## 2012-06-22 DIAGNOSIS — R05 Cough: Secondary | ICD-10-CM

## 2012-06-22 DIAGNOSIS — R059 Cough, unspecified: Secondary | ICD-10-CM

## 2012-06-22 DIAGNOSIS — R058 Other specified cough: Secondary | ICD-10-CM

## 2012-06-22 DIAGNOSIS — L299 Pruritus, unspecified: Secondary | ICD-10-CM

## 2012-06-22 DIAGNOSIS — Z23 Encounter for immunization: Secondary | ICD-10-CM

## 2012-06-22 MED ORDER — CEFUROXIME AXETIL 500 MG PO TABS: 500.0000 mg | ORAL_TABLET | Freq: Two times a day (BID) | ORAL | Status: DC

## 2012-06-22 MED ORDER — ALPRAZOLAM 0.25 MG PO TABS: 0.2500 mg | ORAL_TABLET | Freq: Every evening | ORAL | Status: DC | PRN

## 2012-06-22 MED ORDER — GUAIFENESIN ER 600 MG PO TB12: 1200.0000 mg | ORAL_TABLET | Freq: Two times a day (BID) | ORAL | Status: AC

## 2012-06-22 NOTE — Progress Notes (Signed)
  Subjective:    Patient ID: Tina Stokes, female    DOB: [birth year], 47 y.o.   MRN: [id number]  HPI Complains of persisting dry cough Onset 3 weeks Precipitated initially by URI symptoms which has since resolved Associated with headache, fever, sore throat or malaise Wants to be checked to make sure she does not have "pneumonia"  Also persisting scalp itch Requests refill on antibiotics for same Has been evaluated by numerous dermatologists for same condition without specific diagnosis request referral to rheumatology for same  Past Medical History  Diagnosis Date  . ANEMIA-NOS   . CONTACT DERMATITIS&OTHER ECZEMA DUE UNSPEC CAUSE   . DEPRESSION   . Acne     Review of Systems  Constitutional: Negative for fever and fatigue.  Respiratory: Positive for cough and chest tightness. Negative for choking, shortness of breath, wheezing and stridor.   Cardiovascular: Negative for chest pain and palpitations.  Skin: Negative for rash and wound.  Neurological: Negative for dizziness, weakness and numbness.       Objective:   Physical Exam BP 120/82  Pulse 69  Temp 97.5 F (36.4 C) (Oral)  Wt 151 lb 12.8 oz (68.856 kg)  SpO2 98%  LMP 06/01/2012 Wt Readings from Last 3 Encounters:  06/22/12 151 lb 12.8 oz (68.856 kg)  03/21/11 143 lb (64.864 kg)  02/21/11 142 lb 9.6 oz (64.683 kg)   Constitutional: She appears well-developed and well-nourished. No distress.  HENT: Head: Normocephalic and atraumatic. nontender Ears: B TMs ok, no erythema or effusion; Nose: Nose normal. Mouth/Throat: Oropharynx is clear and moist. No oropharyngeal exudate.  Eyes: Conjunctivae and EOM are normal. Pupils are equal, round, and reactive to light. No scleral icterus.  Neck: Normal range of motion. Neck supple. No JVD present. No thyromegaly present.  Cardiovascular: Normal rate, regular rhythm and normal heart sounds.  No murmur heard. No BLE edema. Pulmonary/Chest: Effort normal and breath sounds  normal. No respiratory distress. She has no wheezes.  Neurological: She is alert and oriented to person, place, and time. No cranial nerve deficit. Coordination normal.  Skin: Scalp without lesion -Skin is warm and dry. No rash noted. No erythema.  Psychiatric: She has a normal mood and affect. Her behavior is normal. Judgment and thought content normal.   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 O2 sats normal and lung exam clear. Reassurance provided. Recommend symptomatic care with scheduled Mucinex twice a day for next 7 days  Chronic scalp itch, no evidence of cellulitis but experiences significant relief with antibiotics -prior treatment with dapsone, topical steroids and even methotrexate trial by dermatologist in past have not offered same relief -refer to rheumatology now as requested

## 2012-06-22 NOTE — Patient Instructions (Signed)
It was good to see you today. We have reviewed your prior records including labs and tests today Use Mucinex twice daily for next 5-7 days to control cough symptoms, no evidence for pneumonia Will refer to rheumatology for evaluation of your scalp itch Okay to use Ceftin 500 mg twice daily for one week if severe flare Refill on alprazolam as needed Your prescription(s) have been submitted to your pharmacy. Please take as directed and contact our office if you believe you are having problem(s) with the medication(s). Please schedule followup in 6-12 months medical physical and labs, call sooner if problems. Tdap immunization given today, look into influenza vaccination as discussed

## 2012-06-22 NOTE — Assessment & Plan Note (Signed)
Overlap with anxiety and manifest with "scalp itch" when flared Multiple prior derm evals including [facility] for same and felt to be psychogenic - Prior tx venlafex by gyn not tolerated - also stopped citalopram due to side effects  Exam benign -see above re: refer to rheum per request Refill alprazolam

## 2012-06-27 ENCOUNTER — Encounter: Payer: BC Managed Care – PPO | Admitting: Women's Health

## 2012-07-04 ENCOUNTER — Encounter: Payer: Self-pay | Admitting: Women's Health

## 2012-07-04 ENCOUNTER — Ambulatory Visit (INDEPENDENT_AMBULATORY_CARE_PROVIDER_SITE_OTHER): Payer: BC Managed Care – PPO | Admitting: Women's Health

## 2012-07-04 VITALS — BP 108/72 | Ht 62.0 in | Wt 150.0 lb

## 2012-07-04 DIAGNOSIS — Z833 Family history of diabetes mellitus: Secondary | ICD-10-CM

## 2012-07-04 DIAGNOSIS — E079 Disorder of thyroid, unspecified: Secondary | ICD-10-CM

## 2012-07-04 DIAGNOSIS — Z13220 Encounter for screening for lipoid disorders: Secondary | ICD-10-CM

## 2012-07-04 DIAGNOSIS — Z01419 Encounter for gynecological examination (general) (routine) without abnormal findings: Secondary | ICD-10-CM

## 2012-07-04 LAB — LIPID PANEL
HDL: 93 mg/dL (ref >39)
LDL Cholesterol: 104 mg/dL — ABNORMAL HIGH (ref 0–99)
Total CHOL/HDL Ratio: 2.2 Ratio
Triglycerides: 60 mg/dL (ref <150)

## 2012-07-04 NOTE — Patient Instructions (Signed)

## 2012-07-04 NOTE — Progress Notes (Signed)
Tina Stokes 05-26-1966 [id number]    History:    The patient presents for annual exam.  Regular monthly 4 days cycle/BTL. History of normal Paps and mammograms. Has had a problem with itchy scalp for 8 years with negative biopsy x3. Questionable eczema but no relief with topical creams, has followup with rheumatologist. TSH 1.4 2012. Has had a 30 pound weight gain over the past 5 years.  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  ROS:  A  ROS was performed and pertinent positives and negatives are included in the history.  Exam:  Filed Vitals:   07/04/12 0947  BP: 108/72    General appearance:  Normal Head/Neck:  Normal, without cervical or supraclavicular adenopathy. Thyroid:  Symmetrical, normal in size, without palpable masses or nodularity. Respiratory  Effort:  Normal  Auscultation:  Clear without wheezing or rhonchi Cardiovascular  Auscultation:  Regular rate, without rubs, murmurs or gallops  Edema/varicosities:  Not grossly evident Abdominal  Soft,nontender, without masses, guarding or rebound.  Liver/spleen:  No organomegaly noted  Hernia:  None appreciated  Skin  Inspection:  Grossly normal  Palpation:  Grossly normal Neurologic/psychiatric  Orientation:  Normal with appropriate conversation.  Mood/affect:  Normal  Genitourinary    Breasts: Examined lying and sitting/pendulous.     Right: Without masses, retractions, discharge or axillary adenopathy.     Left: Without masses, retractions, discharge or axillary adenopathy.   Inguinal/mons:  Normal without inguinal adenopathy  External genitalia:  Normal  BUS/Urethra/Skene's glands:  Normal  Bladder:  Normal  Vagina:  Normal  Cervix:  Normal  Uterus:   normal in size, shape and  contour.  Midline and mobile  Adnexa/parametria:     Rt: Without masses or tenderness.   Lt: Without masses or tenderness.  Anus and perineum: Normal  Digital rectal exam: Normal sphincter tone without palpated masses or tenderness  Assessment/Plan:  47 y.o. MBF G3 P2 for annual exam doing well other than continued problems with scalp itching.  Normal GYN exam/BTL Persistent scalp itching  Plan: CBC, glucose, lipid panel, TSH, UA, Pap normal 02/2011, new screening guidelines reviewed. Keep scheduled appointment with rheumatologist to further investigate autoimmune problem with persistent scalp itching. SBE's, continue annual mammogram, calcium rich diet, vitamin D 1000 encourage daily. Discussed importance of increasing regular exercise, decreasing calories for weight loss.    Harrington Challenger [facility], 10:36 AM 07/04/2012

## 2012-07-05 LAB — CBC WITH DIFFERENTIAL/PLATELET
Eosinophils Absolute: 0.1 10*3/uL (ref 0.0–0.7)
Eosinophils Relative: 2 % (ref 0–5)
HCT: 34.2 % — ABNORMAL LOW (ref 36.0–46.0)
Hemoglobin: 11 g/dL — ABNORMAL LOW (ref 12.0–15.0)
Lymphs Abs: 1.8 10*3/uL (ref 0.7–4.0)
MCH: 27 pg (ref 26.0–34.0)
MCV: 83.8 fL (ref 78.0–100.0)
Monocytes Absolute: 0.3 10*3/uL (ref 0.1–1.0)
Monocytes Relative: 8 % (ref 3–12)
Platelets: 338 10*3/uL (ref 150–400)
RBC: 4.08 MIL/uL (ref 3.87–5.11)

## 2012-07-05 LAB — URINALYSIS W MICROSCOPIC + REFLEX CULTURE
Casts: NONE SEEN
Crystals: NONE SEEN
Leukocytes, UA: NEGATIVE
Nitrite: NEGATIVE
Specific Gravity, Urine: 1.009 (ref 1.005–1.030)
Urobilinogen, UA: 0.2 mg/dL (ref 0.0–1.0)
pH: 6.5 (ref 5.0–8.0)

## 2012-08-29 ENCOUNTER — Encounter: Payer: Self-pay | Admitting: Gynecology

## 2012-08-29 ENCOUNTER — Ambulatory Visit (INDEPENDENT_AMBULATORY_CARE_PROVIDER_SITE_OTHER): Payer: BC Managed Care – PPO | Admitting: Gynecology

## 2012-08-29 DIAGNOSIS — N898 Other specified noninflammatory disorders of vagina: Secondary | ICD-10-CM

## 2012-08-29 DIAGNOSIS — L293 Anogenital pruritus, unspecified: Secondary | ICD-10-CM

## 2012-08-29 LAB — WET PREP FOR TRICH, YEAST, CLUE
Clue Cells Wet Prep HPF POC: NONE SEEN
Trich, Wet Prep: NONE SEEN
WBC, Wet Prep HPF POC: NONE SEEN
Yeast Wet Prep HPF POC: NONE SEEN

## 2012-08-29 MED ORDER — TERCONAZOLE 0.8 % VA CREA: 1.0000 | TOPICAL_CREAM | Freq: Every day | VAGINAL | Status: DC

## 2012-08-29 NOTE — Progress Notes (Signed)
Patient presents complaining of vaginal itching and slight discharge. Onset after antibiotic treatment for acne. Was treated with Diflucan by the dermatologist and her symptoms transiently improved but never went away and now have returned. No urinary symptoms or odor.  Exam which Kim assistant Pelvic external BUS vagina with mild erythematous vulvitis. Vagina with slight white discharge. Cervix normal. Uterus normal size midline mobile nontender. Adnexa without masses or tenderness.  Assessment and plan: History and exam consistent with yeast for the vaginitis. Wet prep is negative. Suspect partially treated yeast. Will treat with Terazol 3 day cream internal/external. Followup if symptoms persist, worsen or recur.

## 2012-08-29 NOTE — Patient Instructions (Signed)
Use Terazol cream nightly both inside the vagina with applicator and outside for 3 nights. Followup if symptoms persist, worsen or recur.

## 2012-09-24 ENCOUNTER — Encounter: Payer: Self-pay | Admitting: Family

## 2012-09-24 ENCOUNTER — Ambulatory Visit (INDEPENDENT_AMBULATORY_CARE_PROVIDER_SITE_OTHER): Payer: BC Managed Care – PPO | Admitting: Family

## 2012-09-24 VITALS — BP 118/68 | HR 59 | Ht 61.0 in | Wt 148.0 lb

## 2012-09-24 DIAGNOSIS — L989 Disorder of the skin and subcutaneous tissue, unspecified: Secondary | ICD-10-CM

## 2012-09-24 DIAGNOSIS — L439 Lichen planus, unspecified: Secondary | ICD-10-CM

## 2012-09-24 MED ORDER — HYDROCORTISONE 1 % EX CREA: TOPICAL_CREAM | Freq: Two times a day (BID) | CUTANEOUS | Status: DC

## 2012-09-24 NOTE — Progress Notes (Signed)
Subjective:    Patient ID: Tina Stokes, female    DOB: 1965-11-28, 47 y.o.   MRN: [id number]  HPI 47 year old [place] American female, new patient to the practice is in to be established. She has concerns of scalp eructation that's been previously diagnosed as lichen planus. This chronic condition ongoing x8 years. The symptoms originally began after getting braids. She continues to have sensitivity to the scalp that will flare and cause plaque to build up on the scalp. She has [facility], [facility], other dermatologists, rheumatologist, holistic physicians, and acupuncture and no one has a tumor for her symptoms. Her concern today is she continues to have a soreness to the crown of her scalp that on going on a daily basis. She shot multiple medications to include steroid injections to the scalp, Neurontin, Cymbalta, Lyrica, prednisone, Rogaine as well with multiple creams. 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 Her issue today as it continues to chronically been painful. She's also had multiple testing done to include lupus that a lot of negative. She denies any increase in stress in her life. Appears to be coping well. She is employed as a Associate Professor. Patient believes the prednisone his work best in the past her scalp pain but pain returns after cessation of the medication. She's taken Lyrica and is unsure if it helped in the past or not. Was unable to tolerate Cymbalta. Took methotrexate but did not help.     Review of Systems  Constitutional: Negative.   HENT: Negative.   Respiratory: Negative.   Cardiovascular: Negative.   Genitourinary: Negative.   Skin: Negative.        Scalp soreness  Allergic/Immunologic: Negative.   Hematological: Negative.   Psychiatric/Behavioral: Negative.    Past Medical History  Diagnosis Date  . ANEMIA-NOS   . CONTACT DERMATITIS&OTHER ECZEMA DUE UNSPEC CAUSE   . DEPRESSION   . Acne     History   Social History  .  Marital Status: Married    Spouse Name: N/A    Number of Children: N/A  . Years of Education: N/A   Occupational History  . Not on file.   Social History Main Topics  . Smoking status: Never Smoker   . Smokeless tobacco: Never Used     Comment: Married, lives with spouse and 2 kids  . Alcohol Use: Yes     Comment: occassional  . Drug Use: No  . Sexually Active: Yes -- Female partner(s)    Birth Control/ Protection: Surgical   Other Topics Concern  . Not on file   Social History Narrative   Married, lives with spouse and 2 kids    Past Surgical History  Procedure Laterality Date  . Tubal ligation  01/1999  . Pelvic laparoscopy      Family History  Problem Relation Age of Onset  . Hypertension Mother   . Hyperlipidemia Mother   . Cancer Father     lung cancer  . Breast cancer Cousin     either 50 or 51    No Known Allergies  Current Outpatient Prescriptions on File Prior to Visit  Medication Sig Dispense Refill  . ALPRAZolam (XANAX) 0.25 MG tablet Take 1 tablet (0.25 mg total) by mouth at bedtime as needed for sleep.  30 tablet  5  . DOXYCYCLINE CALCIUM PO Take by mouth.      . IRON PO Take by mouth. Not daily      . terconazole (TERAZOL 3) 0.8 %  vaginal cream Place 1 applicator vaginally at bedtime. For 3 nights  20 g  0   No current facility-administered medications on file prior to visit.    BP 118/68  Pulse 59  Ht 5\' 1"  (1.549 m)  Wt 148 lb (67.132 kg)  BMI 27.98 kg/m2  SpO2 97%  LMP 03/18/2014chart    Objective:   Physical Exam  Constitutional: She is oriented to person, place, and time. She appears well-developed and well-nourished.  Neck: Normal range of motion. Neck supple.  Cardiovascular: Normal rate, regular rhythm and normal heart sounds.   Pulmonary/Chest: Effort normal and breath sounds normal.  Abdominal: Soft. Bowel sounds are normal.  Neurological: She is alert and oriented to person, place, and time.  Skin: Skin is warm and dry. No  rash noted.  Tenderness to palpation of the crown of the scalp. No rash.   Psychiatric: She has a normal mood and affect.          Assessment & Plan:  assessment:  1. Lichen planus 2. Scalp pain  Plan: Hydrocortisone ointment to the scalp as needed for inflammation. we'll try Lyrica 75 mg to help calm the nerve endings of the scalp taken twice a day patient call the office if symptoms worsen or persist. We'll followup in 2 weeks and sooner as needed.

## 2012-09-24 NOTE — Patient Instructions (Addendum)
Lichen Planus Lichen planus is a skin problem that causes redness, itching, swelling, and sores. Some common areas affected are the:  Vulva and vagina.  Gums and inside of the mouth.  Esophagus.  Scalp.  Skin of the arms (especially wrists), legs, chest, back, and abdomen.  Fingernails or toenails. CAUSES The cause is not known. It could be an autoimmune illness or an allergy. An autoimmune illness is one where your body attacks itself. Lichen planus is not passed from one person to another (not contagious). It can last for a long time. Affected children usually have a history of other family members with lichen planus. SYMPTOMS  Itching, which can be severe.  The vaginal area may be red, sore, raw, or have a burning feeling.  There may be pain or bleeding during sex.  Scarring may cause the vagina to become short, narrow, or even closed up.  The skin may have small reddish or purplish, flat-topped, round or irregularly shaped bumps.  There may be redness or white patches on the gums or tongue.  The nails may become thin, rough, or have ridges in them.  The eyes can rarely also be involved with pain, redness, or scarring. DIAGNOSIS Your caregiver will look for skin changes, changes inside your mouth, or vaginal discharge. Sometimes, a tissue sample (biopsy) may be sent for testing. TREATMENT  Your caregiver may order a cream (topical steroid) to be put on the sores.  You may be given medicine to take by mouth.  You may be treated by exposure to ultraviolet light.  Sores in the mouth may be treated with lozenges that you suck on like a cough drop.  If the vagina becomes too tight, you may be taught how to use a dilator to keep it open. HOME CARE INSTRUCTIONS  Only take over-the-counter or prescription medicines as directed by your caregiver.  Keep the vaginal area as clean and dry as possible. SEEK MEDICAL CARE IF: You develop increasing pain, swelling, or  redness. Document Released: 10/07/2010 Document Revised: 08/08/2011 Document Reviewed: 10/07/2010 [facility] Patient Information 2013 [place], [place].

## 2012-09-30 ENCOUNTER — Encounter: Payer: Self-pay | Admitting: Family

## 2012-10-01 ENCOUNTER — Other Ambulatory Visit: Payer: Self-pay | Admitting: Family

## 2012-10-01 MED ORDER — PREGABALIN 75 MG PO CAPS: 75.0000 mg | ORAL_CAPSULE | Freq: Two times a day (BID) | ORAL | Status: DC

## 2012-10-01 MED ORDER — HYDROCORTISONE 1 % EX OINT: TOPICAL_OINTMENT | Freq: Two times a day (BID) | CUTANEOUS | Status: DC

## 2012-12-16 ENCOUNTER — Encounter: Payer: Self-pay | Admitting: Family

## 2012-12-18 ENCOUNTER — Other Ambulatory Visit: Payer: Self-pay | Admitting: Family

## 2012-12-18 MED ORDER — PREGABALIN 100 MG PO CAPS: 100.0000 mg | ORAL_CAPSULE | Freq: Two times a day (BID) | ORAL | Status: DC

## 2013-01-16 ENCOUNTER — Encounter: Payer: Self-pay | Admitting: Family

## 2013-01-16 ENCOUNTER — Other Ambulatory Visit: Payer: Self-pay | Admitting: *Deleted

## 2013-01-16 MED ORDER — ALPRAZOLAM 0.25 MG PO TABS: 0.2500 mg | ORAL_TABLET | Freq: Every evening | ORAL | Status: DC | PRN

## 2013-01-16 NOTE — Telephone Encounter (Signed)
Needs OV.  

## 2013-02-18 ENCOUNTER — Encounter: Payer: Self-pay | Admitting: Family

## 2013-02-19 ENCOUNTER — Other Ambulatory Visit: Payer: Self-pay

## 2013-02-19 MED ORDER — ALPRAZOLAM 0.25 MG PO TABS: 0.2500 mg | ORAL_TABLET | Freq: Every evening | ORAL | Status: DC | PRN

## 2013-02-20 ENCOUNTER — Encounter: Payer: Self-pay | Admitting: Family

## 2013-03-18 ENCOUNTER — Encounter: Payer: Self-pay | Admitting: Family

## 2013-03-18 ENCOUNTER — Other Ambulatory Visit: Payer: Self-pay | Admitting: Family

## 2013-03-18 MED ORDER — ALPRAZOLAM 0.25 MG PO TABS: 0.2500 mg | ORAL_TABLET | Freq: Every evening | ORAL | Status: DC | PRN

## 2013-03-27 ENCOUNTER — Ambulatory Visit (INDEPENDENT_AMBULATORY_CARE_PROVIDER_SITE_OTHER): Payer: BC Managed Care – PPO | Admitting: Family

## 2013-03-27 ENCOUNTER — Encounter: Payer: Self-pay | Admitting: Family

## 2013-03-27 VITALS — BP 98/64 | HR 83 | Wt 149.0 lb

## 2013-03-27 DIAGNOSIS — G5139 Clonic hemifacial spasm, unspecified: Secondary | ICD-10-CM

## 2013-03-27 DIAGNOSIS — L659 Nonscarring hair loss, unspecified: Secondary | ICD-10-CM

## 2013-03-27 DIAGNOSIS — G518 Other disorders of facial nerve: Secondary | ICD-10-CM

## 2013-03-27 LAB — CBC WITH DIFFERENTIAL/PLATELET
Basophils Absolute: 0 10*3/uL (ref 0.0–0.1)
Lymphocytes Relative: 42.4 % (ref 12.0–46.0)
Monocytes Relative: 7.9 % (ref 3.0–12.0)
Neutrophils Relative %: 46.6 % (ref 43.0–77.0)
Platelets: 335 10*3/uL (ref 150.0–400.0)
RDW: 14.8 % — ABNORMAL HIGH (ref 11.5–14.6)

## 2013-03-27 LAB — BASIC METABOLIC PANEL
CO2: 28 mEq/L (ref 19–32)
Chloride: 106 mEq/L (ref 96–112)
Sodium: 140 mEq/L (ref 135–145)

## 2013-03-27 MED ORDER — DERMA-SMOOTHE/FS BODY 0.01 % EX OIL: 1.0000 "application " | TOPICAL_OIL | Freq: Two times a day (BID) | CUTANEOUS | Status: DC

## 2013-03-27 NOTE — Progress Notes (Signed)
Subjective:    Patient ID: Tina Stokes, female    DOB: Jan 02, 1966, 47 y.o.   MRN: [id number]  HPI  47 year old [place] American female, nonsmoker is in today scalp irritation. Her last office visit we started Lyrica to help with the itching and burning sensation. It has worked remarkably well. However, she reports that she now has developed some hair loss to the crown of her head. Hair appears to be breaking at the shaft and not from the scalp. She wears her natural without chemicals.   Reports recently had been facial tension that causes her left eyebrow to raise her several minutes. Her her facial muscles later relax and returned back to normal. However she's noticed it more over the last several weeks than ever before and has grown more concerned. Patient does report more shortness of her son being a senior in high school and she's not really sure of what his plans are to high school and her daughter being a Printmaker. Reports her husband is preparing to be a Optician, dispensing which is also been stressful for her.   Review of Systems  Constitutional: Negative.   Respiratory: Negative.   Cardiovascular: Negative.   Gastrointestinal: Negative.   Genitourinary: Negative.   Musculoskeletal: Negative.   Skin: Negative.        Scalp itchy and burning at times. Hair loss   Neurological: Negative for facial asymmetry, weakness, light-headedness and numbness.       Facial spasm at times  Psychiatric/Behavioral: Negative.    Past Medical History  Diagnosis Date  . ANEMIA-NOS   . CONTACT DERMATITIS&OTHER ECZEMA DUE UNSPEC CAUSE   . DEPRESSION   . Acne     History   Social History  . Marital Status: Married    Spouse Name: N/A    Number of Children: N/A  . Years of Education: N/A   Occupational History  . Not on file.   Social History Main Topics  . Smoking status: Never Smoker   . Smokeless tobacco: Never Used     Comment: Married, lives with spouse and 2 kids  . Alcohol Use: Yes      Comment: occassional  . Drug Use: No  . Sexual Activity: Yes    Partners: Male    Birth Control/ Protection: Surgical   Other Topics Concern  . Not on file   Social History Narrative   Married, lives with spouse and 2 kids    Past Surgical History  Procedure Laterality Date  . Tubal ligation  01/1999  . Pelvic laparoscopy      Family History  Problem Relation Age of Onset  . Hypertension Mother   . Hyperlipidemia Mother   . Cancer Father     lung cancer  . Breast cancer Cousin     either 50 or 51    No Known Allergies  Current Outpatient Prescriptions on File Prior to Visit  Medication Sig Dispense Refill  . ALPRAZolam (XANAX) 0.25 MG tablet Take 1 tablet (0.25 mg total) by mouth at bedtime as needed for sleep.  30 tablet  1  . hydrocortisone 1 % ointment Apply topically 2 (two) times daily.  30 g  0  . pregabalin (LYRICA) 100 MG capsule Take 1 capsule (100 mg total) by mouth 2 (two) times daily.  60 capsule  3  . terconazole (TERAZOL 3) 0.8 % vaginal cream Place 1 applicator vaginally at bedtime. For 3 nights  20 g  0  . DOXYCYCLINE CALCIUM PO Take by  mouth.      . IRON PO Take by mouth. Not daily       No current facility-administered medications on file prior to visit.    BP 98/64  Pulse 83  Wt 149 lb (67.586 kg)  BMI 28.17 kg/m2    Objective:   Physical Exam  Constitutional: She is oriented to person, place, and time. She appears well-developed and well-nourished.  Neck: Normal range of motion. Neck supple.  Cardiovascular: Normal rate, regular rhythm and normal heart sounds.   Pulmonary/Chest: Effort normal and breath sounds normal.  Abdominal: Soft.  Neurological: She is alert and oriented to person, place, and time.  Skin: Skin is warm and dry.  Evidence of breakage at the lower shaft of the hair. No scalp hair loss.   Psychiatric: She has a normal mood and affect.          Assessment & Plan:  assessment: 1. Hair loss 2. Anxiety 3.  Facial muscle spasm  Plan: ANA, CBC, TSH, BMP sent. Refer to neurology to see if she has any nerve involvement causing involuntary facial spasm. Ultimately, her symptoms could really back to stress. Derma-Smoothe oil to the scalp to help facilitate hair growth. Also suggest B12 and Biotin.

## 2013-03-27 NOTE — Patient Instructions (Signed)
Stress Stress-related medical problems are becoming increasingly common. The body has a built-in physical response to stressful situations. Faced with pressure, challenge or danger, we need to react quickly. Our bodies release hormones such as cortisol and adrenaline to help do this. These hormones are part of the "fight or flight" response and affect the metabolic rate, heart rate and blood pressure, resulting in a heightened, stressed state that prepares the body for optimum performance in dealing with a stressful situation. It is likely that early man required these mechanisms to stay alive, but usually modern stresses do not call for this, and the same hormones released in today's world can damage health and reduce coping ability. CAUSES  Pressure to perform at work, at school or in sports.  Threats of physical violence.  Money worries.  Arguments.  Family conflicts.  Divorce or separation from significant other.  Bereavement.  New job or unemployment.  Changes in location.  Alcohol or drug abuse. SOMETIMES, THERE IS NO PARTICULAR REASON FOR DEVELOPING STRESS. Almost all people are at risk of being stressed at some time in their lives. It is important to know that some stress is temporary and some is long term.  Temporary stress will go away when a situation is resolved. Most people can cope with short periods of stress, and it can often be relieved by relaxing, taking a walk, chatting through issues with friends, or having a good night's sleep.  Chronic (long-term, continuous) stress is much harder to deal with. It can be psychologically and emotionally damaging. It can be harmful both for an individual and for friends and family. SYMPTOMS Everyone reacts to stress differently. There are some common effects that help [place] recognize it. In times of extreme stress, people may:  Shake uncontrollably.  Breathe faster and deeper than normal (hyperventilate).  Vomit.  For people  with asthma, stress can trigger an attack.  For some people, stress may trigger migraine headaches, ulcers, and body pain. PHYSICAL EFFECTS OF STRESS MAY INCLUDE:  Loss of energy.  Skin problems.  Hair problems.  Aches and pains resulting from tense muscles, including neck ache, backache and tension headaches.  Increased pain from arthritis and other conditions.  Irregular heart beat (palpitations).  Periods of irritability or anger.  Apathy or depression.  Anxiety (feeling uptight or worrying).  Unusual behavior.  Loss of appetite.  Comfort eating.  Lack of concentration.  Loss of, or decreased, sex-drive.  Increased smoking, drinking, or recreational drug use.  For women, missed periods.  Ulcers, joint pain, and muscle pain. Post-traumatic stress is the stress caused by any serious accident, strong emotional damage, or extremely difficult or violent experience such as rape or war. Post-traumatic stress victims can experience mixtures of emotions such as fear, shame, depression, guilt or anger. It may include recurrent memories or images that may be haunting. These feelings can last for weeks, months or even years after the traumatic event that triggered them. Specialized treatment, possibly with medicines and psychological therapies, is available. If stress is causing physical symptoms, severe distress or making it difficult for you to function as normal, it is worth seeing your caregiver. It is important to remember that although stress is a usual part of life, extreme or prolonged stress can lead to other illnesses that will need treatment. It is better to visit a doctor sooner rather than later. Stress has been linked to the development of high blood pressure and heart disease, as well as insomnia and depression. There is no  diagnostic test for stress since everyone reacts to it differently. But a caregiver will be able to spot the physical symptoms, such  as:  Headaches.  Shingles.  Ulcers. Emotional distress such as intense worry, low mood or irritability should be detected when the doctor asks pertinent questions to identify any underlying problems that might be the cause. In case there are physical reasons for the symptoms, the doctor may also want to do some tests to exclude certain conditions. If you feel that you are suffering from stress, try to identify the aspects of your life that are causing it. Sometimes you may not be able to change or avoid them, but even a small change can have a positive ripple effect. A simple lifestyle change can make all the difference. STRATEGIES THAT CAN HELP DEAL WITH STRESS:  Delegating or sharing responsibilities.  Avoiding confrontations.  Learning to be more assertive.  Regular exercise.  Avoid using alcohol or street drugs to cope.  Eating a healthy, balanced diet, rich in fruit and vegetables and proteins.  Finding humor or absurdity in stressful situations.  Never taking on more than you know you can handle comfortably.  Organizing your time better to get as much done as possible.  Talking to friends or family and sharing your thoughts and fears.  Listening to music or relaxation tapes.  Tensing and then relaxing your muscles, starting at the toes and working up to the head and neck. If you think that you would benefit from help, either in identifying the things that are causing your stress or in learning techniques to help you relax, see a caregiver who is capable of helping you with this. Rather than relying on medications, it is usually better to try and identify the things in your life that are causing stress and try to deal with them. There are many techniques of managing stress including counseling, psychotherapy, aromatherapy, yoga, and exercise. Your caregiver can help you determine what is best for you. Document Released: 08/06/2002 Document Revised: 08/08/2011 Document  Reviewed: 07/03/2007 [facility] Patient Information 2014 [place], [place].

## 2013-03-28 ENCOUNTER — Ambulatory Visit: Payer: BC Managed Care – PPO | Admitting: Family

## 2013-03-28 LAB — ANA: Anti Nuclear Antibody(ANA): NEGATIVE

## 2013-04-04 ENCOUNTER — Other Ambulatory Visit: Payer: Self-pay

## 2013-04-15 ENCOUNTER — Encounter: Payer: Self-pay | Admitting: Neurology

## 2013-04-15 ENCOUNTER — Ambulatory Visit (INDEPENDENT_AMBULATORY_CARE_PROVIDER_SITE_OTHER): Payer: BC Managed Care – PPO | Admitting: Neurology

## 2013-04-15 VITALS — BP 100/60 | HR 80 | Temp 97.8°F | Ht 61.0 in | Wt 149.1 lb

## 2013-04-15 DIAGNOSIS — H02519 Abnormal innervation syndrome unspecified eye, unspecified eyelid: Secondary | ICD-10-CM

## 2013-04-15 DIAGNOSIS — H02516 Abnormal innervation syndrome left eye, unspecified eyelid: Secondary | ICD-10-CM

## 2013-04-15 DIAGNOSIS — R209 Unspecified disturbances of skin sensation: Secondary | ICD-10-CM

## 2013-04-15 LAB — VITAMIN B12: Vitamin B-12: >1500 pg/mL — ABNORMAL HIGH (ref 211–911)

## 2013-04-15 MED ORDER — DIAZEPAM 5 MG PO TABS: ORAL_TABLET | ORAL | Status: DC

## 2013-04-15 NOTE — Progress Notes (Signed)
[facility] Clinic Note - Initial Visit   Date: 04/15/2013    Tina Stokes MRN: [id number] DOB: 1965-12-08   Dear Dr Orvan Falconer:  Thank you for your kind referral of Tina Stokes for consultation of facial spasm. Although her history is well known to you, please allow [place] to reiterate it for the purpose of our medical record. The patient was accompanied to the clinic by self.   History of Present Illness: Tina Stokes is a 47 y.o. year-old left-handed African American female with history of depression and contact dermatitis presenting for evaluation of facial spasm.    For the past 8-years, she has been having uncontrollable itching and inflamed scalp (vertex of head).  She was given steroids and neurontin which did not help.  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  About 5-years ago, she had a MRI of the brain which did not show any intracranial process. She saw a neurologist at [facility].  About one year ago, she started noticing left sided facial spasm which started intermittently, but over the past 51-months it starting occuring 2-3 times per week, but she has not noticed it at all in the past month. She says that her left eye brow will go up, lasting only a few seconds.  There is no pain, but it feels "tight" as if the muscle is contracting. There is no associated pulling of the cheek, lip, or jaw.  Denies any exacerbating or alleviating factors.  There is no pattern or triggers, such as fatigue or stress.  No prior history of Bell's palsy.  She has seen Rheumatology earlier this year (see below for labs).    She also complains of bilateral hand tingling/numbness over the distal fingers especially when she wakes up at night.    Out-side paper records, electronic medical record, and images have been reviewed where available and  summarized as:  Labs 07/2012:  ESR 10, ANA - neg   Lab Results  Component Value Date   WBC 4.4* 03/27/2013   HGB 10.5* 03/27/2013   HCT 31.7* 03/27/2013   MCV 83.1 03/27/2013   PLT 335.0 03/27/2013   Lab Results  Component Value Date   TSH 1.72 03/27/2013     Past Medical History  Diagnosis Date  . ANEMIA-NOS   . CONTACT DERMATITIS&OTHER ECZEMA DUE UNSPEC CAUSE   . DEPRESSION   . Acne     Past Surgical History  Procedure Laterality Date  . Tubal ligation  01/1999  . Pelvic laparoscopy       Medications:  Current Outpatient Prescriptions on File Prior to Visit  Medication Sig Dispense Refill  . ALPRAZolam (XANAX) 0.25 MG tablet Take 1 tablet (0.25 mg total) by mouth at bedtime as needed for sleep.  30 tablet  1  . DOXYCYCLINE CALCIUM PO Take by mouth.      . Fluocinolone Acetonide (DERMA-SMOOTHE/FS BODY) 0.01 % OIL Apply 1 application topically 2 (two) times daily.  118 mL  3  . hydrocortisone 1 % ointment Apply topically 2 (two) times daily.  30 g  0  . IRON PO Take by mouth. Not daily      . pregabalin (LYRICA) 100 MG capsule Take 1 capsule (100 mg total) by mouth 2 (two) times daily.  60 capsule  3  . terconazole (TERAZOL 3) 0.8 % vaginal cream Place 1 applicator vaginally at bedtime. For 3 nights  20 g  0   No current facility-administered medications on file prior to visit.    Allergies: No Known Allergies  Family History: Family History  Problem Relation Age of Onset  . Hypertension Mother   . Hyperlipidemia Mother   . Cancer Father     lung cancer  . Breast cancer Cousin     either 50 or 43    Social History: History   Social History  . Marital Status: Married    Spouse Name: N/A    Number of Children: N/A  . Years of Education: N/A   Occupational History  . Not on file.   Social History Main Topics  . Smoking status: Never Smoker   . Smokeless tobacco: Never Used     Comment: Married, lives with spouse and 2 kids  . Alcohol Use: No      Comment: occassional  . Drug Use: No  . Sexual Activity: Yes    Partners: Male    Birth Control/ Protection: Surgical   Other Topics Concern  . Not on file   Social History Narrative   Married, lives with spouse and 2 kids    Review of Systems:  CONSTITUTIONAL: No fevers, chills, night sweats, or weight loss.   EYES: No visual changes or eye pain ENT: No hearing changes.  No history of nose bleeds.   RESPIRATORY: No cough, wheezing and shortness of breath.   CARDIOVASCULAR: Negative for chest pain, and palpitations.   GI: Negative for abdominal discomfort, blood in stools or black stools.  No recent change in bowel habits.   GU:  No history of incontinence.   MUSCLOSKELETAL: No history of joint pain or swelling.  No myalgias.   SKIN: Negative for lesions, rash, and itching.   HEMATOLOGY/ONCOLOGY: Negative for prolonged bleeding, bruising easily, and swollen nodes.  No history of cancer.   ENDOCRINE: Negative for cold or heat intolerance, polydipsia or goiter.   PSYCH:  No depression or anxiety symptoms.   NEURO: As Above.   Vital Signs:  BP 100/60  Pulse 80  Temp(Src) 97.8 F (36.6 C) (Oral)  Ht 5\' 1"  (1.549 m)  Wt 149 lb 1.6 oz (67.631 kg)  BMI 28.19 kg/m2  LMP 04/07/2013   General Medical Exam:   General:  Well appearing, comfortable.   Eyes/ENT: see cranial nerve examination.  Hair at the top of head, no edema palpation. Neck: No masses appreciated.  Full range of motion without tenderness.   Respiratory:  Clear to auscultation, good air entry bilaterally.   Cardiac:  Regular rate and rhythm, no murmur.    Extremities:  No deformities, edema, or skin discoloration. Good capillary refill.   Skin:  Skin color, texture, turgor normal. No rashes or lesions.  Neurological Exam: MENTAL STATUS including orientation to time, place, person, recent and remote memory, attention span and concentration, language, and fund of knowledge is normal.  Speech is not  dysarthric.  CRANIAL NERVES: II:  No visual field defects.  Unremarkable fundi.   III-IV-VI: Pupils equal round and reactive to light.  Normal conjugate, extra-ocular eye movements in all directions of gaze.  No nystagmus.  No ptosis.   V:  Normal facial sensation.   VII:  Normal facial symmetry and movements.    VIII:  Normal hearing and vestibular function.   IX-X:  Normal palatal movement.   XI:  Normal shoulder shrug and head rotation.   XII:  Normal tongue strength and range of motion, no deviation or fasciculation.  MOTOR:  No atrophy, fasciculations or abnormal movements.  No pronator drift.  Tone is normal.    Right Upper Extremity:    Left Upper Extremity:    Deltoid  5/5   Deltoid  5/5   Biceps  5/5   Biceps  5/5   Triceps  5/5   Triceps  5/5   Wrist extensors  5/5   Wrist extensors  5/5   Wrist flexors  5/5   Wrist flexors  5/5   Finger extensors  5/5   Finger extensors  5/5   Finger flexors  5/5   Finger flexors  5/5   Dorsal interossei  5/5   Dorsal interossei  5/5   Abductor pollicis  5-/5   Abductor pollicis  5/5   Tone (Ashworth scale)  0  Tone (Ashworth scale)  0   Right Lower Extremity:    Left Lower Extremity:    Hip flexors  5/5   Hip flexors  5/5   Hip extensors  5/5   Hip extensors  5/5   Knee flexors  5/5   Knee flexors  5/5   Knee extensors  5/5   Knee extensors  5/5   Dorsiflexors  5/5   Dorsiflexors  5/5   Plantarflexors  5/5   Plantarflexors  5/5   Toe extensors  5/5   Toe extensors  5/5   Toe flexors  5/5   Toe flexors  5/5   Tone (Ashworth scale)  0  Tone (Ashworth scale)  0   MSRs:  Right                                                                 Left brachioradialis 2+  brachioradialis 2+  biceps 2+  biceps 2+  triceps 2+  triceps 2+  patellar 2+  patellar 2+  ankle jerk 2+  ankle jerk 2+  Hoffman no  Hoffman no  plantar response down  plantar response down   SENSORY:  Normal and symmetric perception of light touch, pinprick,  vibration, and proprioception.  Romberg's sign absent.   COORDINATION/GAIT: Normal finger-to- nose-finger and heel-to-shin.  Intact rapid alternating movements bilaterally.  Able to rise from a chair without using arms.  Gait narrow based and stable. Tandem and stressed gait intact.    IMPRESSION: Ms. Rivkin is a 47 year-old female presenting for evaluation of left facial spasm.  Her neurological examination today does not disclose any abnormalities.  Regarding her facial spasm, it seems that only her left frontalis muscle contracts, without involvement of her lower face.  Very mild left hemifacial spasm is possible, but it is atypical for it to be so focal.  She is not bothered by the symptoms and since it occurs infrequently, I would like to follow her clinically.  Given her intermittent sensory symptoms of her hands and head, I would like to obtain and MRI of the brain.  I do not believe that her scalp itching and hair loss is due to primary neuropathy since it does not conform to a dermatomal or cutaneous nerve distribution and her tingling pain may be due to local irritation of the nerves from the swelling.  For her hand paresthesias, I will obtain an EMG of the arms to look for carpal  tunnel syndrome.   PLAN/RECOMMENDATIONS:  1.  MRI brain wwo contrast.  Take valium 5mg  by mouth 30 minutes prior to MRI brain.  Prescription has been sent to your pharmacy. 2.  EMG of bilateral hands 3.  Check vitamin B12 and MMA 4.  Return to clinic in 52-month  The duration of this appointment visit was 60 minutes of face-to-face time with the patient.  Greater than 50% of this time was spent in counseling, explanation of diagnosis, planning of further management, and coordination of care.   Thank you for allowing me to participate in patient's care.  If I can answer any additional questions, I would be pleased to do so.    Sincerely,    Bradlee Heitman K. Allena Katz, DO

## 2013-04-15 NOTE — Patient Instructions (Addendum)
1.  MRI brain wwo contrast.  Take valium 5mg  by mouth 30 minutes prior to MRI brain.  Prescription has been sent to your pharmacy. November 24 @ 1:00 please arrive at 12:45 entrance A [phone]. 2.  EMG of bilateral hands 3.  Check vitamin B12 and MMA 4.  Return to clinic in 60-month

## 2013-04-17 LAB — METHYLMALONIC ACID, SERUM: Methylmalonic Acid, Quant: 0.11 umol/L (ref <0.40)

## 2013-04-19 ENCOUNTER — Telehealth: Payer: Self-pay | Admitting: Neurology

## 2013-04-19 NOTE — Telephone Encounter (Signed)
MRI dept called, they need signed order for MRI sch for Monday / Sherri

## 2013-04-19 NOTE — Telephone Encounter (Signed)
Order signed and faxed.

## 2013-04-22 ENCOUNTER — Ambulatory Visit (HOSPITAL_COMMUNITY)
Admission: RE | Admit: 2013-04-22 | Discharge: 2013-04-22 | Disposition: A | Discharge status: Home / Self Care | Payer: BC Managed Care – PPO | Source: Ambulatory Visit | Attending: Neurology | Admitting: Neurology

## 2013-04-22 DIAGNOSIS — G518 Other disorders of facial nerve: Secondary | ICD-10-CM | POA: Insufficient documentation

## 2013-04-22 DIAGNOSIS — R209 Unspecified disturbances of skin sensation: Secondary | ICD-10-CM

## 2013-04-22 DIAGNOSIS — J3489 Other specified disorders of nose and nasal sinuses: Secondary | ICD-10-CM | POA: Insufficient documentation

## 2013-04-22 DIAGNOSIS — L659 Nonscarring hair loss, unspecified: Secondary | ICD-10-CM | POA: Insufficient documentation

## 2013-04-22 DIAGNOSIS — H02516 Abnormal innervation syndrome left eye, unspecified eyelid: Secondary | ICD-10-CM

## 2013-04-22 MED ORDER — GADOBENATE DIMEGLUMINE 529 MG/ML IV SOLN
15.0000 mL | Freq: Once | INTRAVENOUS | Status: AC | PRN
  Administered 2013-04-22: 14 mL via INTRAVENOUS

## 2013-05-06 ENCOUNTER — Other Ambulatory Visit: Payer: Self-pay | Admitting: Family

## 2013-05-06 NOTE — Telephone Encounter (Signed)
Ok to fill 

## 2013-05-13 ENCOUNTER — Ambulatory Visit (INDEPENDENT_AMBULATORY_CARE_PROVIDER_SITE_OTHER): Payer: BC Managed Care – PPO | Admitting: Neurology

## 2013-05-13 ENCOUNTER — Other Ambulatory Visit: Payer: Self-pay | Admitting: Family

## 2013-05-13 ENCOUNTER — Encounter: Payer: Self-pay | Admitting: Neurology

## 2013-05-13 DIAGNOSIS — G5600 Carpal tunnel syndrome, unspecified upper limb: Secondary | ICD-10-CM

## 2013-05-13 DIAGNOSIS — G5602 Carpal tunnel syndrome, left upper limb: Secondary | ICD-10-CM

## 2013-05-13 DIAGNOSIS — G5601 Carpal tunnel syndrome, right upper limb: Secondary | ICD-10-CM

## 2013-05-13 NOTE — Progress Notes (Signed)
See procedure note for EMG results.  Donika K. Patel, DO  

## 2013-05-13 NOTE — Procedures (Signed)
[facility]  [address], [address]  [place], [place] [address] Tel: [phone] Fax:  [phone] Test Date:  05/13/2013  Patient: Tina Stokes DOB: 1965/07/13 Physician: Nita Sickle, DO  Sex: Female Height: 5\' 1"  Ref Phys:   ID#: [id number] Temp: 34.6C Technician:    Patient Complaints: This is a 47 year-old female presenting with paresthesis of both hands.  NCV & EMG Findings: Extensive evaluation of the right upper extremity and additional studies of the left reveals:  1. Abnormal palmar studies based on absolute change in median and ulnar distal latencies bilaterally.  2. Normal median and ulnar sensory responses bilaterally. Radial sensory response on the right is normal. 3. Normal median and ulnar motor responses bilaterally. 4. Sparse chronic motor axonal loss changes are seen in the abductor pollicis brevis bilaterally without active denervation.  Impression: 1. 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 2. There is no evidence of a cervical radiculopathy affecting the right side.   ___________________________ Nita Sickle, DO    Nerve Conduction Studies Anti Sensory Summary Table   Site NR Peak (ms) Norm Peak (ms) P-T Amp (V) Norm P-T Amp  Left Median Anti Sensory (2nd Digit)  Wrist    2.8 <3.4 25.7 >20  Right Median Anti Sensory (2nd Digit)  Wrist    3.1 <3.4 26.4 >20  Right Radial Anti Sensory (Base 1st Digit)  Wrist    2.4 <2.7 32.2 >18  Left Ulnar Anti Sensory (5th Digit)  Wrist    3.1 <3.1 32.0 >12  Right Ulnar Anti Sensory (5th Digit)  Wrist    2.4 <3.1 47.6 >12   Motor Summary Table   Site NR Onset (ms) Norm Onset (ms) O-P Amp (mV) Norm O-P Amp Site1 Site2 Delta-0 (ms) Dist (cm) Vel (m/s) Norm Vel (m/s)  Left Median Motor (Abd Poll Brev)  Wrist    2.6 <3.9 10.6 >6 Elbow Wrist 4.8 29.0 60 >50  Elbow    7.4  10.6          Right Median Motor (Abd Poll Brev)  Wrist    2.8 <3.9 11.8 >6 Elbow Wrist 5.2 31.0 60 >50  Elbow    8.0  11.3         Left Ulnar Motor (Abd Dig Minimi)  Wrist    2.7 <3.1 13.8 >7 B Elbow Wrist 3.2 23.0 72 >50  B Elbow    5.9  12.3  A Elbow B Elbow 1.6 10.0 63 >50  A Elbow    7.5  12.1         Right Ulnar Motor (Abd Dig Minimi)  Wrist    2.0 <3.1 13.0 >7 B Elbow Wrist 3.4 22.0 65 >50  B Elbow    5.4  12.8  A Elbow B Elbow 1.6 10.0 63 >50  A Elbow    7.0  12.8          Comparison Summary Table   Site NR Peak (ms) Norm Peak (ms) P-T Amp (V) Site1 Site2 Delta-P (ms) Norm Delta (ms)  Left Median/Ulnar Palm Comparison (Wrist - 8cm)  Median Palm    1.8 <2.2 56.0 Median Palm Ulnar Palm 0.5   Ulnar Palm    1.3 <2.2 13.1      Right Median/Ulnar Palm Comparison (Wrist - 8cm)  Median Palm    2.1 <2.2 28.5 Median Palm Ulnar Palm 0.5   Ulnar  Palm    1.6 <2.2 27.9       EMG   Side Muscle Ins Act Fibs Psw Fasc Number Recrt Dur Dur. Amp Amp. Poly Poly. Comment  Right 1stDorInt Nml Nml Nml Nml Nml Nml Nml Nml Nml Nml Nml Nml N/A  Right Ext Indicis Nml Nml Nml Nml Nml Nml Nml Nml Nml Nml Nml Nml N/A  Right Abd Poll Brev Nml Nml Nml Nml Nml Mod Few 1+ Few 1+ Nml Nml N/A  Right FlexPolLong Nml Nml Nml Nml Nml Nml Nml Nml Nml Nml Nml Nml N/A  Right PronatorTeres Nml Nml Nml Nml Nml Nml Nml Nml Nml Nml Nml Nml N/A  Right Biceps Nml Nml Nml Nml Nml Nml Nml Nml Nml Nml Nml Nml N/A  Right Triceps Nml Nml Nml Nml Nml Nml Nml Nml Nml Nml Nml Nml N/A  Right Deltoid Nml Nml Nml Nml Nml Nml Nml Nml Nml Nml Nml Nml N/A  Left Abd Poll Brev Nml Nml Nml Nml Nml Mod Few 1+ Few 1+ Nml Nml N/A  Left PronatorTeres Nml Nml Nml Nml Nml Nml Nml Nml Nml Nml Nml Nml N/A      Waveforms:

## 2013-05-20 ENCOUNTER — Ambulatory Visit: Payer: BC Managed Care – PPO | Admitting: Neurology

## 2013-06-09 ENCOUNTER — Other Ambulatory Visit: Payer: Self-pay | Admitting: Family

## 2013-06-10 NOTE — Telephone Encounter (Signed)
Pt has est with Neurology. Should this be handled by them now or ok to fill?

## 2013-06-11 ENCOUNTER — Encounter: Payer: Self-pay | Admitting: Women's Health

## 2013-06-13 ENCOUNTER — Telehealth: Payer: Self-pay | Admitting: Family

## 2013-06-13 NOTE — Telephone Encounter (Signed)
Pt needs samples of lyrica 100 mg

## 2013-06-14 NOTE — Telephone Encounter (Signed)
Left message to advise pt that an Rx was sent to pharmacy on 06/09/13. We have no samples available. Advised pt to call back

## 2013-06-14 NOTE — Telephone Encounter (Signed)
Pt will pick up sample today

## 2013-07-08 ENCOUNTER — Other Ambulatory Visit: Payer: Self-pay | Admitting: Family

## 2013-07-10 ENCOUNTER — Other Ambulatory Visit (HOSPITAL_COMMUNITY)
Admission: RE | Admit: 2013-07-10 | Discharge: 2013-07-10 | Disposition: A | Discharge status: Home / Self Care | Payer: 59 | Source: Ambulatory Visit | Attending: Gynecology | Admitting: Gynecology

## 2013-07-10 ENCOUNTER — Encounter: Payer: Self-pay | Admitting: Women's Health

## 2013-07-10 ENCOUNTER — Ambulatory Visit (INDEPENDENT_AMBULATORY_CARE_PROVIDER_SITE_OTHER): Payer: 59 | Admitting: Women's Health

## 2013-07-10 VITALS — BP 124/78 | Ht 62.0 in | Wt 145.0 lb

## 2013-07-10 DIAGNOSIS — Z01419 Encounter for gynecological examination (general) (routine) without abnormal findings: Secondary | ICD-10-CM

## 2013-07-10 DIAGNOSIS — D259 Leiomyoma of uterus, unspecified: Secondary | ICD-10-CM

## 2013-07-10 DIAGNOSIS — D219 Benign neoplasm of connective and other soft tissue, unspecified: Secondary | ICD-10-CM

## 2013-07-10 DIAGNOSIS — F411 Generalized anxiety disorder: Secondary | ICD-10-CM

## 2013-07-10 MED ORDER — ALPRAZOLAM 0.25 MG PO TABS: ORAL_TABLET | ORAL | Status: DC

## 2013-07-10 NOTE — Progress Notes (Signed)
JAYD FORREY Jan 26, 1966 [id number]    History:    Presents for annual exam.  4 day monthly cycle/BTL. Occasional hot flushes. Normal Pap and mammogram history. 2005 negative colonoscopy/ IBS. Labs primary care.  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  ROS:  A  ROS was performed and pertinent positives and negatives are included.  Exam:  Filed Vitals:   07/10/13 1014  BP: 124/78    General appearance:  Normal Thyroid:  Symmetrical, normal in size, without palpable masses or nodularity. Respiratory  Auscultation:  Clear without wheezing or rhonchi Cardiovascular  Auscultation:  Regular rate, without rubs, murmurs or gallops  Edema/varicosities:  Not grossly evident Abdominal  Soft,nontender, without masses, guarding or rebound.  Liver/spleen:  No organomegaly noted  Hernia:  None appreciated  Skin  Inspection:  Grossly normal   Breasts: Examined lying and sitting.     Right: Without masses, retractions, discharge or axillary adenopathy.     Left: Without masses, retractions, discharge or axillary adenopathy. Gentitourinary   Inguinal/mons:  Normal without inguinal adenopathy  External genitalia:  Normal  BUS/Urethra/Skene's glands:  Normal  Vagina:  Normal  Cervix:  Normal  Uterus:  Enlarged questionable fibroid, non tender  Midline and mobile  Adnexa/parametria:     Rt: Without masses or tenderness.   Lt: Without masses or tenderness.  Anus and perineum: Normal  Digital rectal exam: Normal sphincter tone without palpated masses or tenderness  Assessment/Plan:  48 y.o. MBF G2P2 for annual exam.     Normal GYN exam/BTL Situational stress  Plan: SBE's, continue annual mammogram, regular exercise, calcium rich diet, vitamin D 1000 daily encouraged. Menopause discussed. Xanax 0.25 at bedtime when  necessary prescription, the addictive properties reviewed. Pap, Pap normal 02/2011, new screening guidelines reviewed.    Huel Cote WHNP, 1:07 PM 07/10/2013

## 2013-07-10 NOTE — Patient Instructions (Signed)
Health Recommendations for Postmenopausal Women Respected and ongoing research has looked at the most common causes of death, disability, and poor quality of life in postmenopausal women. The causes include heart disease, diseases of blood vessels, diabetes, depression, cancer, and bone loss (osteoporosis). Many things can be done to help lower the chances of developing these and other common problems: CARDIOVASCULAR DISEASE Heart Disease: A heart attack is a medical emergency. Know the signs and symptoms of a heart attack. Below are things women can do to reduce their risk for heart disease.   Do not smoke. If you smoke, quit.  Aim for a healthy weight. Being overweight causes many preventable deaths. Eat a healthy and balanced diet and drink an adequate amount of liquids.  Get moving. Make a commitment to be more physically active. Aim for 30 minutes of activity on most, if not all days of the week.  Eat for heart health. Choose a diet that is low in saturated fat and cholesterol and eliminate trans fat. Include whole grains, vegetables, and fruits. Read and understand the labels on food containers before buying.  Know your numbers. Ask your caregiver to check your blood pressure, cholesterol (total, HDL, LDL, triglycerides) and blood glucose. Work with your caregiver on improving your entire clinical picture.  High blood pressure. Limit or stop your table salt intake (try salt substitute and food seasonings). Avoid salty foods and drinks. Read labels on food containers before buying. Eating well and exercising can help control high blood pressure. STROKE  Stroke is a medical emergency. Stroke may be the result of a blood clot in a blood vessel in the brain or by a brain hemorrhage (bleeding). Know the signs and symptoms of a stroke. To lower the risk of developing a stroke:  Avoid fatty foods.  Quit smoking.  Control your diabetes, blood pressure, and irregular heart rate. THROMBOPHLEBITIS  (BLOOD CLOT) OF THE LEG  Becoming overweight and leading a stationary lifestyle may also contribute to developing blood clots. Controlling your diet and exercising will help lower the risk of developing blood clots. CANCER SCREENING  Breast Cancer: Take steps to reduce your risk of breast cancer.  You should practice "breast self-awareness." This means understanding the normal appearance and feel of your breasts and should include breast self-examination. Any changes detected, no matter how small, should be reported to your caregiver.  After age 40, you should have a clinical breast exam (CBE) every year.  Starting at age 40, you should consider having a mammogram (breast X-ray) every year.  If you have a family history of breast cancer, talk to your caregiver about genetic screening.  If you are at high risk for breast cancer, talk to your caregiver about having an MRI and a mammogram every year.  Intestinal or Stomach Cancer: Tests to consider are a rectal exam, fecal occult blood, sigmoidoscopy, and colonoscopy. Women who are high risk may need to be screened at an earlier age and more often.  Cervical Cancer:  Beginning at age 30, you should have a Pap test every 3 years as long as the past 3 Pap tests have been normal.  If you have had past treatment for cervical cancer or a condition that could lead to cancer, you need Pap tests and screening for cancer for at least 20 years after your treatment.  If you had a hysterectomy for a problem that was not cancer or a condition that could lead to cancer, then you no longer need Pap tests.    If you are between ages 65 and 70, and you have had normal Pap tests going back 10 years, you no longer need Pap tests.  If Pap tests have been discontinued, risk factors (such as a new sexual partner) need to be reassessed to determine if screening should be resumed.  Some medical problems can increase the chance of getting cervical cancer. In these  cases, your caregiver may recommend more frequent screening and Pap tests.  Uterine Cancer: If you have vaginal bleeding after reaching menopause, you should notify your caregiver.  Ovarian cancer: Other than yearly pelvic exams, there are no reliable tests available to screen for ovarian cancer at this time except for yearly pelvic exams.  Lung Cancer: Yearly chest X-rays can detect lung cancer and should be done on high risk women, such as cigarette smokers and women with chronic lung disease (emphysema).  Skin Cancer: A complete body skin exam should be done at your yearly examination. Avoid overexposure to the sun and ultraviolet light lamps. Use a strong sun block cream when in the sun. All of these things are important in lowering the risk of skin cancer. MENOPAUSE Menopause Symptoms: Hormone therapy products are effective for treating symptoms associated with menopause:  Moderate to severe hot flashes.  Night sweats.  Mood swings.  Headaches.  Tiredness.  Loss of sex drive.  Insomnia.  Other symptoms. Hormone replacement carries certain risks, especially in older women. Women who use or are thinking about using estrogen or estrogen with progestin treatments should discuss that with their caregiver. Your caregiver will help you understand the benefits and risks. The ideal dose of hormone replacement therapy is not known. The Food and Drug Administration (FDA) has concluded that hormone therapy should be used only at the lowest doses and for the shortest amount of time to reach treatment goals.  OSTEOPOROSIS Protecting Against Bone Loss and Preventing Fracture: If you use hormone therapy for prevention of bone loss (osteoporosis), the risks for bone loss must outweigh the risk of the therapy. Ask your caregiver about other medications known to be safe and effective for preventing bone loss and fractures. To guard against bone loss or fractures, the following is recommended:  If  you are less than age 50, take 1000 mg of calcium and at least 600 mg of Vitamin D per day.  If you are greater than age 50 but less than age 70, take 1200 mg of calcium and at least 600 mg of Vitamin D per day.  If you are greater than age 70, take 1200 mg of calcium and at least 800 mg of Vitamin D per day. Smoking and excessive alcohol intake increases the risk of osteoporosis. Eat foods rich in calcium and vitamin D and do weight bearing exercises several times a week as your caregiver suggests. DIABETES Diabetes Melitus: If you have Type I or Type 2 diabetes, you should keep your blood sugar under control with diet, exercise and recommended medication. Avoid too many sweets, starchy and fatty foods. Being overweight can make control more difficult. COGNITION AND MEMORY Cognition and Memory: Menopausal hormone therapy is not recommended for the prevention of cognitive disorders such as Alzheimer's disease or memory loss.  DEPRESSION  Depression may occur at any age, but is common in elderly women. The reasons may be because of physical, medical, social (loneliness), or financial problems and needs. If you are experiencing depression because of medical problems and control of symptoms, talk to your caregiver about this. Physical activity and   exercise may help with mood and sleep. Community and volunteer involvement may help your sense of value and worth. If you have depression and you feel that the problem is getting worse or becoming severe, talk to your caregiver about treatment options that are best for you. ACCIDENTS  Accidents are common and can be serious in the elderly woman. Prepare your house to prevent accidents. Eliminate throw rugs, place hand bars in the bath, shower and toilet areas. Avoid wearing high heeled shoes or walking on wet, snowy, and icy areas. Limit or stop driving if you have vision or hearing problems, or you feel you are unsteady with you movements and  reflexes. HEPATITIS C Hepatitis C is a type of viral infection affecting the liver. It is spread mainly through contact with blood from an infected person. It can be treated, but if left untreated, it can lead to severe liver damage over years. Many people who are infected do not know that the virus is in their blood. If you are a "baby-boomer", it is recommended that you have one screening test for Hepatitis C. IMMUNIZATIONS  Several immunizations are important to consider having during your senior years, including:   Tetanus, diptheria, and pertussis booster shot.  Influenza every year before the flu season begins.  Pneumonia vaccine.  Shingles vaccine.  Others as indicated based on your specific needs. Talk to your caregiver about these. Document Released: 07/08/2005 Document Revised: 05/02/2012 Document Reviewed: 03/03/2008 ExitCare Patient Information 2014 [employer].  

## 2013-07-26 ENCOUNTER — Other Ambulatory Visit: Payer: 59

## 2013-07-26 ENCOUNTER — Ambulatory Visit: Payer: 59 | Admitting: Women's Health

## 2013-07-31 ENCOUNTER — Ambulatory Visit (INDEPENDENT_AMBULATORY_CARE_PROVIDER_SITE_OTHER): Payer: 59 | Admitting: Women's Health

## 2013-07-31 ENCOUNTER — Ambulatory Visit (INDEPENDENT_AMBULATORY_CARE_PROVIDER_SITE_OTHER): Payer: 59

## 2013-07-31 ENCOUNTER — Other Ambulatory Visit: Payer: Self-pay | Admitting: Women's Health

## 2013-07-31 ENCOUNTER — Encounter: Payer: Self-pay | Admitting: Women's Health

## 2013-07-31 DIAGNOSIS — D259 Leiomyoma of uterus, unspecified: Secondary | ICD-10-CM

## 2013-07-31 DIAGNOSIS — D219 Benign neoplasm of connective and other soft tissue, unspecified: Secondary | ICD-10-CM

## 2013-07-31 DIAGNOSIS — D251 Intramural leiomyoma of uterus: Secondary | ICD-10-CM

## 2013-07-31 DIAGNOSIS — D252 Subserosal leiomyoma of uterus: Secondary | ICD-10-CM

## 2013-07-31 HISTORY — DX: Leiomyoma of uterus, unspecified: D25.9

## 2013-07-31 NOTE — Patient Instructions (Signed)
Fibroids Fibroids are lumps (tumors) that can occur any place in a woman's body. These lumps are not cancerous. Fibroids vary in size, weight, and where they grow. HOME CARE  Do not take aspirin.  Write down the number of pads or tampons you use during your period. Tell your doctor. This can help determine the best treatment for you. GET HELP RIGHT AWAY IF:  You have pain in your lower belly (abdomen) that is not helped with medicine.  You have cramps that are not helped with medicine.  You have more bleeding between or during your period.  You feel lightheaded or pass out (faint).  Your lower belly pain gets worse. MAKE SURE YOU:  Understand these instructions.  Will watch your condition.  Will get help right away if you are not doing well or get worse. Document Released: 06/18/2010 Document Revised: 08/08/2011 Document Reviewed: 06/18/2010 ExitCare Patient Information 2014 [employer].  

## 2013-07-31 NOTE — Progress Notes (Signed)
Patient ID: Tina Stokes, female   DOB: February 17, 1966, 48 y.o.   MRN: [id number] Presents for ultrasound. Has had intermittent low abdominal fullness/discomfort discussed at annual exam. Irregular periods/ mostly monthly/BTL.   Ultrasound: Anteverted uterus fibroids right anterior 31 x 22 x 33 mm subserous, intramural 16 x 17 mm. Right and left ovary normal. Negative cul-de-sac. Tried layered endometrium.  Fibroid uterus.  Plan: Keep menstrual record, if space greater than 3 months instructed to call. Reviewed fibroids small but may be contributing to fullness feeling. Will watch at this time.

## 2013-08-05 ENCOUNTER — Other Ambulatory Visit: Payer: Self-pay | Admitting: *Deleted

## 2013-08-05 ENCOUNTER — Other Ambulatory Visit: Payer: Self-pay | Admitting: Family

## 2013-08-05 MED ORDER — CITRANATAL 90 DHA 90-1 & 300 MG PO MISC: ORAL | Status: DC

## 2013-08-13 ENCOUNTER — Ambulatory Visit: Payer: BC Managed Care – PPO | Admitting: Neurology

## 2013-09-04 ENCOUNTER — Other Ambulatory Visit: Payer: Self-pay | Admitting: Family

## 2013-09-23 ENCOUNTER — Encounter: Payer: Self-pay | Admitting: Family

## 2013-09-26 ENCOUNTER — Other Ambulatory Visit: Payer: Self-pay | Admitting: Neurology

## 2013-09-26 ENCOUNTER — Encounter: Payer: Self-pay | Admitting: Neurology

## 2013-09-26 ENCOUNTER — Ambulatory Visit (INDEPENDENT_AMBULATORY_CARE_PROVIDER_SITE_OTHER): Payer: 59 | Admitting: Neurology

## 2013-09-26 VITALS — BP 108/64 | HR 61 | Wt 144.6 lb

## 2013-09-26 DIAGNOSIS — R238 Other skin changes: Secondary | ICD-10-CM

## 2013-09-26 DIAGNOSIS — M792 Neuralgia and neuritis, unspecified: Secondary | ICD-10-CM

## 2013-09-26 DIAGNOSIS — IMO0002 Reserved for concepts with insufficient information to code with codable children: Secondary | ICD-10-CM

## 2013-09-26 DIAGNOSIS — L989 Disorder of the skin and subcutaneous tissue, unspecified: Secondary | ICD-10-CM

## 2013-09-26 MED ORDER — PREGABALIN 50 MG PO CAPS: 50.0000 mg | ORAL_CAPSULE | Freq: Two times a day (BID) | ORAL | Status: DC

## 2013-09-26 NOTE — Patient Instructions (Signed)
1. Start taking extra Lyrica 50mg  at bedtime x 7 days.  If tolerating, add extra dose of lyrica to morning and evening (total 150mg  twice daily) 2.  Check blood work today 3.  Return to clinic 12-months

## 2013-09-26 NOTE — Progress Notes (Signed)
Follow-up Visit   Date: 09/26/2013    Tina Stokes MRN: [id number] DOB: [birth year]   Interim History: Tina Stokes is a 48 y.o. left-handed African American female with history of depression and contact dermatitis returning to the clinic for follow-up of scalp discomfort.  The patient was accompanied to the clinic by self.   She was last seen in the clinic on 04/15/2013.   History of present illness: Since mid-2000s, she has been having uncontrollable itching and inflamed scalp (vertex of head). She was given steroids and neurontin which did not help. 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 About 5-years ago, she had a MRI of the brain which did not show any intracranial process. She saw a neurologist at [place].   In 2013, she started noticing left sided facial spasm which started intermittently, but in the fall of 2014, it starting occuring 2-3 times per week and slowly improved. She says that her left eye brow will go up, lasting only a few seconds. There is no pain, but it feels "tight" as if the muscle is contracting. There is no associated pulling of the cheek, lip, or jaw. No prior history of Bell's palsy.   She also complains of bilateral hand tingling/numbness over the distal fingers especially when she wakes up at night.   - Follow-up 09/26/2013:  She initially saw me for facial spasm which has since resolved.  For and paresthesias EMG of the upper extremities was performed which showed bilateral median neuropathy, very mild in degree electrically. She continues to have scalp discomfort and was started on Lyrica 175m BID which seems to help but she continues to have some baseline discomfort. She has no new neurological symptoms. She denies any history of headaches, nausea, photophobia, or phonophobia.   Medications:  Current Outpatient Prescriptions on  File Prior to Visit  Medication Sig Dispense Refill  . ALPRAZolam (XANAX) 0.25 MG tablet Take one tablet as needed  30 tablet  1  . Fluocinolone Acetonide (DERMA-SMOOTHE/FS BODY) 0.01 % OIL Apply 1 application topically 2 (two) times daily.  118 mL  3  . hydrocortisone 1 % ointment Apply topically 2 (two) times daily.  30 g  0  . IRON PO Take by mouth. Not daily      . LYRICA 100 MG capsule TAKE ONE CAPSULE BY MOUTH TWICE DAILY  60 capsule  0  . Prenat w/o A-FeCbGl-DSS-FA-DHA (CITRANATAL 90 DHA) 90-1 & 300 MG MISC Take one day  90 each  4   No current facility-administered medications on file prior to visit.    Allergies: No Known Allergies   Review of Systems:  CONSTITUTIONAL: No fevers, chills, night sweats, or weight loss.   EYES: No visual changes or eye pain ENT: No hearing changes.  No history of nose bleeds.   RESPIRATORY: No cough, wheezing and shortness of breath.   CARDIOVASCULAR: Negative for chest pain, and palpitations.   GI: Negative for abdominal discomfort, blood in stools or black stools.  No recent change in bowel habits.   GU:  No history of incontinence.   MUSCLOSKELETAL: No history of joint pain or swelling.  No myalgias.   SKIN: Negative for lesions, rash, and itching.   ENDOCRINE: Negative for cold or heat intolerance, polydipsia or goiter.   PSYCH:  No depression or anxiety symptoms.   NEURO: As Above.   Vital Signs:  BP 108/64  Pulse 61  Wt 144 lb 9 oz (65.573 kg)  SpO2 97%   Neurological Exam: MENTAL STATUS including orientation to time, place, person, recent and remote memory, attention span and concentration, language, and fund of knowledge is normal.  Speech is not dysarthric.  There is no areas of hair thinning or lesions affecting the scalp.  CRANIAL NERVES: No visual field defects. Pupils equal round and reactive to light.  Normal conjugate, extra-ocular eye movements in all directions of gaze.  No ptosis. Normal facial sensation.  Face is  symmetric. Palate elevates symmetrically.  Tongue is midline.  MOTOR:  Motor strength is 5/5 in all extremities.  MSRs:  Reflexes are 2+/4 throughout.  SENSORY:  Intact to vibration and pinprick.  COORDINATION/GAIT: Gait is stable.   Data: Labs 07/2012: ESR 10, ANA - neg Lab Results  Component Value Date   VITAMINB12 >1500* 04/15/2013   Lab Results  Component Value Date   ANA NEG 03/27/2013   MRI brain with and without contrast 04/15/2013: Normal for age MRI appearance of the brain and no abnormality identified except for trace bilateral mastoid fluid, significance doubtful.  EMG of the upper extremities 05/13/2013: 1. 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  2. There is no evidence of a cervical radiculopathy affecting the right side.      IMPRESSION: 1.  Burning scalp syndrome  -  Etiology remains unclear, she has had biopsy in the past consistent with right shin pain this, however the fact that it responds to Lyrica is suggestive of neuropathic pain. Symptoms do not conform to a peripheral nerve or dermatomal distribution, making it difficult to localize.  It is possible that she has complex migraine as these can manifest with dysesthesias without typical migrainous features. For this reason, I think it is reasonable to try to optimize the dose for her comfort.  - MRI brain is unremarkable  - I will check a few more inflammatory labs 2.  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  Left facial spasm - resolved   PLAN/RECOMMENDATIONS:  1.  Check SSA/B, ENA, celiac disease, SPEP/UPEP with IFE 2.  Increase Lyrica to 150 mg twice daily, if tolerable 3.  Return to clinic in 87-month   The duration of this appointment visit was 30 minutes of face-to-face time with the patient.  Greater than 50% of this time was spent in counseling, explanation of  diagnosis, planning of further management, and coordination of care.   Thank you for allowing me to participate in patient's care.  If I can answer any additional questions, I would be pleased to do so.    Sincerely,    Donika K. PPosey Pronto DO

## 2013-09-27 LAB — SJOGREN'S SYNDROME ANTIBODS(SSA + SSB)
SSA (RO) (ENA) ANTIBODY, IGG: NEGATIVE
SSB (LA) (ENA) ANTIBODY, IGG: NEGATIVE

## 2013-09-27 LAB — ENA 9 PANEL
Centromere Ab Screen: <1
ENA SM AB SER-ACNC: NEGATIVE
JO-1 ANTIBODY, IGG: NEGATIVE
Ribosomal P Protein Ab: <1
SM/RNP: <1
SSA (Ro) (ENA) Antibody, IgG: <1
SSB (La) (ENA) Antibody, IgG: <1
Scleroderma (Scl-70) (ENA) Antibody, IgG: <1
ds DNA Ab: 2 IU/mL

## 2013-09-30 LAB — UIFE/LIGHT CHAINS/TP QN, 24-HR UR
ALBUMIN, U: DETECTED
Alpha 1, Urine: DETECTED — AB
Alpha 2, Urine: DETECTED — AB
Beta, Urine: DETECTED — AB
FREE KAPPA LT CHAINS, UR: 0.68 mg/dL (ref 0.14–2.42)
FREE KAPPA/LAMBDA RATIO: 11.33 ratio — AB (ref 2.04–10.37)
Free Lambda Lt Chains,Ur: 0.06 mg/dL (ref 0.02–0.67)
Gamma Globulin, Urine: DETECTED — AB
TOTAL PROTEIN, URINE-UPE24: 1.2 mg/dL

## 2013-09-30 LAB — SPEP & IFE WITH QIG
ALBUMIN ELP: 58.4 % (ref 55.8–66.1)
Alpha-1-Globulin: 5.9 % — ABNORMAL HIGH (ref 2.9–4.9)
Alpha-2-Globulin: 9.7 % (ref 7.1–11.8)
Beta 2: 4.5 % (ref 3.2–6.5)
Beta Globulin: 6 % (ref 4.7–7.2)
Gamma Globulin: 15.5 % (ref 11.1–18.8)
IGM, SERUM: 97 mg/dL (ref 52–322)
IgA: 197 mg/dL (ref 69–380)
IgG (Immunoglobin G), Serum: 1140 mg/dL (ref 690–1700)
TOTAL PROTEIN, SERUM ELECTROPHOR: 7 g/dL (ref 6.0–8.3)

## 2013-10-05 ENCOUNTER — Other Ambulatory Visit: Payer: Self-pay | Admitting: Women's Health

## 2013-10-05 ENCOUNTER — Other Ambulatory Visit: Payer: Self-pay | Admitting: Family

## 2013-10-05 DIAGNOSIS — M792 Neuralgia and neuritis, unspecified: Secondary | ICD-10-CM

## 2013-10-07 ENCOUNTER — Telehealth: Payer: Self-pay | Admitting: Neurology

## 2013-10-07 ENCOUNTER — Telehealth: Payer: Self-pay | Admitting: *Deleted

## 2013-10-07 NOTE — Telephone Encounter (Signed)
Rx called in to [facility].  [phone]

## 2013-10-07 NOTE — Telephone Encounter (Signed)
Caryl Pina, can you confirm what dose pt is taking and if refill was already sent by PCP?  If not, I will sent it.  Belladonna Lubinski K. Posey Pronto, DO

## 2013-10-07 NOTE — Telephone Encounter (Signed)
Called into pharmacy

## 2013-10-07 NOTE — Telephone Encounter (Signed)
Pt called requesting a refill for Lyrica 100mg  bid

## 2013-10-07 NOTE — Telephone Encounter (Signed)
I spoke with Dr. Hale Bogus nurse Sheran Lawless) and she said that the patient is on 100 mg bid.  They did not fill rx yet.

## 2013-10-07 NOTE — Telephone Encounter (Signed)
Message copied by Chester Holstein on Mon Oct 07, 2013  2:36 PM ------      Message from: Narda Amber K      Created: Mon Oct 07, 2013  1:59 PM      Regarding: Lyrica       Please send prescription for Lyrica 100 mg twice a day, #60, 5 refills.            Thanks. ------

## 2013-10-07 NOTE — Telephone Encounter (Signed)
Called patient back and informed her that I had just called the Rx in.

## 2013-11-05 ENCOUNTER — Encounter: Payer: Self-pay | Admitting: Gynecology

## 2013-11-05 ENCOUNTER — Ambulatory Visit (INDEPENDENT_AMBULATORY_CARE_PROVIDER_SITE_OTHER): Payer: 59 | Admitting: Gynecology

## 2013-11-05 ENCOUNTER — Ambulatory Visit: Payer: 59 | Admitting: Women's Health

## 2013-11-05 VITALS — BP 124/80

## 2013-11-05 DIAGNOSIS — L293 Anogenital pruritus, unspecified: Secondary | ICD-10-CM

## 2013-11-05 DIAGNOSIS — R350 Frequency of micturition: Secondary | ICD-10-CM

## 2013-11-05 DIAGNOSIS — L292 Pruritus vulvae: Secondary | ICD-10-CM

## 2013-11-05 DIAGNOSIS — B3731 Acute candidiasis of vulva and vagina: Secondary | ICD-10-CM

## 2013-11-05 DIAGNOSIS — B373 Candidiasis of vulva and vagina: Secondary | ICD-10-CM

## 2013-11-05 DIAGNOSIS — N898 Other specified noninflammatory disorders of vagina: Secondary | ICD-10-CM

## 2013-11-05 LAB — WET PREP FOR TRICH, YEAST, CLUE
Clue Cells Wet Prep HPF POC: NONE SEEN
Trich, Wet Prep: NONE SEEN

## 2013-11-05 LAB — URINALYSIS W MICROSCOPIC + REFLEX CULTURE
Bilirubin Urine: NEGATIVE
Casts: NONE SEEN
Crystals: NONE SEEN
Glucose, UA: NEGATIVE mg/dL
KETONES UR: NEGATIVE mg/dL
Nitrite: NEGATIVE
PROTEIN: NEGATIVE mg/dL
Specific Gravity, Urine: 1.015 (ref 1.005–1.030)
Urobilinogen, UA: 1 mg/dL (ref 0.0–1.0)
pH: 6 (ref 5.0–8.0)

## 2013-11-05 MED ORDER — FLUCONAZOLE 150 MG PO TABS: ORAL_TABLET | ORAL | Status: DC

## 2013-11-05 NOTE — Progress Notes (Addendum)
   The patient presented to the office today complaining of several days of vaginal white discharge along with vulvar pruritus. Some odor. Patient's having normal menstrual cycles. Patient in a monogamous relationship. Patient with prior tubal sterilization procedure.  Exam: Bartholin's urethra Skene's within normal limits Vagina: Thick white discharge was noted Cervix: Same as above Bimanual exam: Not done Rectal exam: Not done  Wet prep:Moniliasis Assessment/plan: Monilial vaginitis will be treated with Diflucan 150 mg one by mouth today.

## 2013-11-05 NOTE — Addendum Note (Signed)
Addended by: Thurnell Garbe A on: 11/05/2013 04:19 PM   Modules accepted: Orders

## 2013-11-05 NOTE — Patient Instructions (Signed)
Monilial Vaginitis  Vaginitis in a soreness, swelling and redness (inflammation) of the vagina and vulva. Monilial vaginitis is not a sexually transmitted infection.  CAUSES   Yeast vaginitis is caused by yeast (candida) that is normally found in your vagina. With a yeast infection, the candida has overgrown in number to a point that upsets the chemical balance.  SYMPTOMS   · White, thick vaginal discharge.  · Swelling, itching, redness and irritation of the vagina and possibly the lips of the vagina (vulva).  · Burning or painful urination.  · Painful intercourse.  DIAGNOSIS   Things that may contribute to monilial vaginitis are:  · Postmenopausal and virginal states.  · Pregnancy.  · Infections.  · Being tired, sick or stressed, especially if you had monilial vaginitis in the past.  · Diabetes. Good control will help lower the chance.  · Birth control pills.  · Tight fitting garments.  · Using bubble bath, feminine sprays, douches or deodorant tampons.  · Taking certain medications that kill germs (antibiotics).  · Sporadic recurrence can occur if you become ill.  TREATMENT   Your caregiver will give you medication.  · There are several kinds of anti monilial vaginal creams and suppositories specific for monilial vaginitis. For recurrent yeast infections, use a suppository or cream in the vagina 2 times a week, or as directed.  · Anti-monilial or steroid cream for the itching or irritation of the vulva may also be used. Get your caregiver's permission.  · Painting the vagina with methylene blue solution may help if the monilial cream does not work.  · Eating yogurt may help prevent monilial vaginitis.  HOME CARE INSTRUCTIONS   · Finish all medication as prescribed.  · Do not have sex until treatment is completed or after your caregiver tells you it is okay.  · Take warm sitz baths.  · Do not douche.  · Do not use tampons, especially scented ones.  · Wear cotton underwear.  · Avoid tight pants and panty  hose.  · Tell your sexual partner that you have a yeast infection. They should go to their caregiver if they have symptoms such as mild rash or itching.  · Your sexual partner should be treated as well if your infection is difficult to eliminate.  · Practice safer sex. Use condoms.  · Some vaginal medications cause latex condoms to fail. Vaginal medications that harm condoms are:  · Cleocin cream.  · Butoconazole (Femstat®).  · Terconazole (Terazol®) vaginal suppository.  · Miconazole (Monistat®) (may be purchased over the counter).  SEEK MEDICAL CARE IF:   · You have a temperature by mouth above 102° F (38.9° C).  · The infection is getting worse after 2 days of treatment.  · The infection is not getting better after 3 days of treatment.  · You develop blisters in or around your vagina.  · You develop vaginal bleeding, and it is not your menstrual period.  · You have pain when you urinate.  · You develop intestinal problems.  · You have pain with sexual intercourse.  Document Released: 02/23/2005 Document Revised: 08/08/2011 Document Reviewed: 11/07/2008  ExitCare® Patient Information ©2014 [employer].

## 2013-11-06 ENCOUNTER — Telehealth: Payer: Self-pay

## 2013-11-06 ENCOUNTER — Other Ambulatory Visit: Payer: Self-pay | Admitting: Gynecology

## 2013-11-06 MED ORDER — TERCONAZOLE 0.8 % VA CREA: 1.0000 | TOPICAL_CREAM | Freq: Every day | VAGINAL | Status: DC

## 2013-11-06 NOTE — Telephone Encounter (Signed)
Patient informed. Rx sent 

## 2013-11-06 NOTE — Telephone Encounter (Signed)
Patient saw you yesterday for yeast inf and received Diflucan tab.  She took it yesterday but said itching is even worse today.  She said it usually takes the 3 day cream as well to clear it up.  She would like to get Rx not for 3 day vaginal cream.

## 2013-11-06 NOTE — Telephone Encounter (Signed)
Call in [place] vaginal.

## 2013-11-07 ENCOUNTER — Other Ambulatory Visit: Payer: Self-pay | Admitting: Gynecology

## 2013-11-07 LAB — URINE CULTURE

## 2013-11-07 MED ORDER — NITROFURANTOIN MONOHYD MACRO 100 MG PO CAPS: 100.0000 mg | ORAL_CAPSULE | Freq: Two times a day (BID) | ORAL | Status: DC

## 2013-11-12 ENCOUNTER — Telehealth: Payer: Self-pay

## 2013-11-12 ENCOUNTER — Encounter: Payer: Self-pay | Admitting: Gynecology

## 2013-11-12 NOTE — Telephone Encounter (Signed)
Emailed response back to patient.

## 2013-11-12 NOTE — Telephone Encounter (Signed)
She can purchase over-the-counter Replens which is a vaginal moisture which she can use when necessary

## 2013-11-12 NOTE — Telephone Encounter (Signed)
Received the following email from patient to Dr. Toney Rakes:  "Hi, I am almost finish the antibiotic for the UTI infection, but I noticed that when I am very dry and irritated in the vaginal area. Is there anything I can do? Thank you."

## 2013-12-02 ENCOUNTER — Other Ambulatory Visit: Payer: Self-pay | Admitting: Women's Health

## 2013-12-02 ENCOUNTER — Encounter: Payer: Self-pay | Admitting: Family

## 2013-12-02 ENCOUNTER — Encounter: Payer: Self-pay | Admitting: Neurology

## 2013-12-02 MED ORDER — PREGABALIN 100 MG PO CAPS: 100.0000 mg | ORAL_CAPSULE | Freq: Two times a day (BID) | ORAL | Status: DC

## 2013-12-02 NOTE — Telephone Encounter (Signed)
Please refill xanax for pt, xanax .25 mg every 8 hours prn  #30 with one refill

## 2013-12-02 NOTE — Telephone Encounter (Signed)
Called into pharmacy

## 2013-12-02 NOTE — Telephone Encounter (Signed)
Ok to refill   thanks

## 2013-12-28 ENCOUNTER — Other Ambulatory Visit: Payer: Self-pay | Admitting: Women's Health

## 2013-12-30 NOTE — Telephone Encounter (Signed)
rx called in [facility] 

## 2013-12-30 NOTE — Telephone Encounter (Signed)
Ok to refill 

## 2013-12-30 NOTE — Telephone Encounter (Signed)
Already called IN [facility]

## 2013-12-31 ENCOUNTER — Ambulatory Visit (INDEPENDENT_AMBULATORY_CARE_PROVIDER_SITE_OTHER): Payer: 59 | Admitting: Neurology

## 2013-12-31 ENCOUNTER — Encounter: Payer: Self-pay | Admitting: Neurology

## 2013-12-31 VITALS — BP 100/64 | HR 62 | Ht 61.42 in | Wt 146.4 lb

## 2013-12-31 DIAGNOSIS — L989 Disorder of the skin and subcutaneous tissue, unspecified: Secondary | ICD-10-CM

## 2013-12-31 DIAGNOSIS — IMO0002 Reserved for concepts with insufficient information to code with codable children: Secondary | ICD-10-CM

## 2013-12-31 DIAGNOSIS — M792 Neuralgia and neuritis, unspecified: Secondary | ICD-10-CM

## 2013-12-31 DIAGNOSIS — R238 Other skin changes: Secondary | ICD-10-CM

## 2013-12-31 MED ORDER — PREGABALIN 50 MG PO CAPS: 50.0000 mg | ORAL_CAPSULE | Freq: Three times a day (TID) | ORAL | Status: DC

## 2013-12-31 NOTE — Progress Notes (Signed)
Follow-up Visit   Date: 12/31/2013    Tina Stokes MRN: [id number] DOB: 03-Feb-1966   Interim History: Tina Stokes is a 48 y.o. left-handed African American female with history of depression and contact dermatitis returning to the clinic for follow-up of scalp discomfort.  The patient was accompanied to the clinic by self.   She was last seen in the clinic on 09/26/2013.  History of present illness: Since mid-2000s, she has been having uncontrollable itching and inflamed scalp (vertex of head). She was given steroids and neurontin which did not help. 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 About 5-years ago, she had a MRI of the brain which did not show any intracranial process. She saw a neurologist at [facility].   In 2013, she started noticing left sided facial spasm which started intermittently, but in the fall of 2014, it starting occuring 2-3 times per week and slowly improved. She says that her left eye brow will go up, lasting only a few seconds. There is no pain, but it feels "tight" as if the muscle is contracting. There is no associated pulling of the cheek, lip, or jaw. No prior history of Bell's palsy.   She also complains of bilateral hand tingling/numbness over the distal fingers especially when she wakes up at night.   Follow-up 09/26/2013:  She initially saw me for facial spasm which has since resolved.  For and paresthesias EMG of the upper extremities was performed which showed bilateral median neuropathy, very mild in degree electrically. She continues to have scalp discomfort and was started on Lyrica 163m BID which seems to help but she continues to have some baseline discomfort.   UPDATE 12/31/2013:  She remains relatively unchanged and still has itching and intermittent burning of the scalp.  She continues to take Lyrica 1062mand 20072mt bedtime  which is controlling symptoms relatively well.  No new complaints.   Medications:  Current Outpatient Prescriptions on File Prior to Visit  Medication Sig Dispense Refill  . ALPRAZolam (XANAX) 0.25 MG tablet TAKE 1 TABLET BY MOUTH EVERY DAY AS NEEDED  30 tablet  0  . IRON PO Take by mouth. Not daily      . pregabalin (LYRICA) 100 MG capsule TAKE 100MG IN THE AM AND 200MG IN THE PM       No current facility-administered medications on file prior to visit.    Allergies:  Allergies  Allergen Reactions  . Penicillins Rash     Review of Systems:  CONSTITUTIONAL: No fevers, chills, night sweats, or weight loss.   EYES: No visual changes or eye pain ENT: No hearing changes.  No history of nose bleeds.   RESPIRATORY: No cough, wheezing and shortness of breath.   CARDIOVASCULAR: Negative for chest pain, and palpitations.   GI: Negative for abdominal discomfort, blood in stools or black stools.  No recent change in bowel habits.   GU:  No history of incontinence.   MUSCLOSKELETAL: No history of joint pain or swelling.  No myalgias.   SKIN: Negative for lesions, rash, and itching.   ENDOCRINE: Negative for cold or heat intolerance, polydipsia or goiter.   PSYCH:  No depression or anxiety symptoms.   NEURO: As Above.   Vital Signs:  BP 100/64  Pulse 62  Ht 5' 1.42" (1.56 m)  Wt 146 lb 7 oz (66.424 kg)  BMI 27.29 kg/m2  SpO2  98%   Neurological Exam: MENTAL STATUS including orientation to time, place, person, recent and remote memory, attention span and concentration, language, and fund of knowledge is normal.  Speech is not dysarthric.  There is no areas of hair thinning or lesions affecting the scalp.  CRANIAL NERVES:  No visual field defects. Pupils equal round and reactive to light.  Normal conjugate, extra-ocular eye movements in all directions of gaze.  No ptosis. Normal facial sensation.  Face is symmetric. Palate elevates symmetrically.  Tongue is midline.  MOTOR:  Motor  strength is 5/5 in all extremities.  MSRs:  Reflexes are 2+/4 throughout.  SENSORY:  Intact to vibration and pinprick.  COORDINATION/GAIT: Gait is stable.   Data: Labs 07/2012: ESR 10, ANA - neg Lab Results  Component Value Date   VITAMINB12 >1500* 04/15/2013   Labs 09/26/2013:  SPEP/UPEP with IFE, ENA Labs 03/27/2914:  ANA neg  MRI brain with and without contrast 04/15/2013: Normal for age MRI appearance of the brain and no abnormality identified except for trace bilateral mastoid fluid, significance doubtful.  EMG of the upper extremities 05/13/2013: 1. 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  2. There is no evidence of a cervical radiculopathy affecting the right side.     IMPRESSION: 1.  Burning scalp syndrome Etiology remains unclear, she has had biopsy in the past consistent with lichen planus, however the fact that it responds to Lyrica is suggestive of neuropathic pain. Symptoms do not conform to a peripheral nerve or dermatomal distribution, making it difficult to localize.  It is possible that she has complex migraine as these can manifest with dysesthesias without typical migrainous features.  MRI brain is unremarkable.  Autoimmune/inflammatory work-up is negative. Clinically, she is doing well and is getting symptomatic benefit with Lyrica 158m and 2080m so will try to taper dose.  2.  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  Left facial spasm - resolved   PLAN/RECOMMENDATIONS:  1.  Start taking 5028mn the morning and continue 200m77m bedtime. She will call with update in 1-2 weeks, if worsening symptoms, continue Lyrica 100mg80mthe morning and 200mg 109medtime. 2.  Return to clinic in 6-mont74-month duration of this appointment visit was 25 minutes of face-to-face time with the patient.  Greater than 50% of this time was spent  in counseling, explanation of diagnosis, planning of further management, and coordination of care.   Thank you for allowing me to participate in patient's care.  If I can answer any additional questions, I would be pleased to do so.    Sincerely,    Donika K. Patel, Posey Pronto

## 2013-12-31 NOTE — Patient Instructions (Signed)
1. Start taking Lyrica 50mg  in the morning and continue 200mg  at bedtime 2. Please call me in 1-2 weeks to let me know if you are tolerated lower dose of lyrica so I can call in 50mg  tablets. 3. Return to clinic in 10-months

## 2014-01-24 ENCOUNTER — Encounter: Payer: Self-pay | Admitting: Women's Health

## 2014-01-24 ENCOUNTER — Ambulatory Visit (INDEPENDENT_AMBULATORY_CARE_PROVIDER_SITE_OTHER): Payer: 59 | Admitting: Women's Health

## 2014-01-24 DIAGNOSIS — B3731 Acute candidiasis of vulva and vagina: Secondary | ICD-10-CM

## 2014-01-24 DIAGNOSIS — N898 Other specified noninflammatory disorders of vagina: Secondary | ICD-10-CM

## 2014-01-24 DIAGNOSIS — B373 Candidiasis of vulva and vagina: Secondary | ICD-10-CM

## 2014-01-24 LAB — WET PREP FOR TRICH, YEAST, CLUE
CLUE CELLS WET PREP: NONE SEEN
Trich, Wet Prep: NONE SEEN

## 2014-01-24 MED ORDER — TERCONAZOLE 0.4 % VA CREA: 1.0000 | TOPICAL_CREAM | Freq: Every day | VAGINAL | Status: DC

## 2014-01-24 MED ORDER — DIFLUCAN 150 MG PO TABS: 150.0000 mg | ORAL_TABLET | Freq: Once | ORAL | Status: DC

## 2014-01-24 NOTE — Progress Notes (Signed)
Patient ID: Tina Stokes, female   DOB: 10-03-1965, 48 y.o.   MRN: [id number] Presents with complaint of increased vaginal irritation with itching for one week. Denies vaginal  odor, urinary symptoms, abdominal pain or fever.  Exam: Appears well. External genitalia erythematous, speculum exam copious white curdy discharge noted vaginal walls are erythematous. Wet prep positive for numerous yeast.  Yeast vaginitis  Plan: Diflucan 150 mg one dose, Terazol 7 one applicator at bedtime x7, instructed to call if no relief of discharge. Yeast prevention discussed.

## 2014-01-24 NOTE — Patient Instructions (Signed)

## 2014-01-29 ENCOUNTER — Telehealth: Payer: Self-pay | Admitting: *Deleted

## 2014-01-29 NOTE — Telephone Encounter (Signed)
Telephone call, states will finish out Terazol prescription, if continued problems will call. States is at least 50% better.

## 2014-01-29 NOTE — Telephone Encounter (Signed)
Pt was treated for yeast infection on OV 01/24/14 took Terazol 7 day cream and diflucan tablet. Pt took Terazol cream during cycle, so now has irritation in vaginal area, slight itchy, but more inflammation. Please advise

## 2014-02-12 ENCOUNTER — Other Ambulatory Visit: Payer: Self-pay | Admitting: *Deleted

## 2014-02-12 ENCOUNTER — Telehealth: Payer: Self-pay | Admitting: *Deleted

## 2014-02-12 MED ORDER — PREGABALIN 100 MG PO CAPS: ORAL_CAPSULE | ORAL | Status: DC

## 2014-02-12 MED ORDER — PREGABALIN 100 MG PO CAPS: 100.0000 mg | ORAL_CAPSULE | Freq: Two times a day (BID) | ORAL | Status: DC

## 2014-02-12 NOTE — Telephone Encounter (Signed)
Patient returning you call Call back # [phone]

## 2014-02-12 NOTE — Telephone Encounter (Signed)
Rx faxed to patient's pharmacy.

## 2014-02-12 NOTE — Telephone Encounter (Signed)
Pt called back, she has not heard from [place] since message left this morning. It does not appear that message was routed to the clinical staff and Caryl Pina has left for the day. I will forward this to [facility] to see if she can assist. / Sherri S.

## 2014-02-12 NOTE — Telephone Encounter (Signed)
Please see message below. Pt is f/u due to her not hearing anything back. Please call pt [phone]

## 2014-02-12 NOTE — Telephone Encounter (Signed)
I called patient and she said that she needs Lyrica refilled.  She takes 100 mg qam and 200 mg qpm.  Will you have someone fax it to [facility] please?  Thanks!

## 2014-02-12 NOTE — Telephone Encounter (Signed)
Rx signed.

## 2014-02-13 ENCOUNTER — Emergency Department (HOSPITAL_COMMUNITY): Payer: 59

## 2014-02-13 ENCOUNTER — Encounter (HOSPITAL_COMMUNITY): Payer: Self-pay | Admitting: Emergency Medicine

## 2014-02-13 ENCOUNTER — Emergency Department (HOSPITAL_COMMUNITY)
Admission: EM | Admit: 2014-02-13 | Discharge: 2014-02-13 | Disposition: A | Discharge status: Home / Self Care | Payer: 59 | Attending: Emergency Medicine | Admitting: Emergency Medicine

## 2014-02-13 DIAGNOSIS — S199XXA Unspecified injury of neck, initial encounter: Secondary | ICD-10-CM

## 2014-02-13 DIAGNOSIS — S99919A Unspecified injury of unspecified ankle, initial encounter: Secondary | ICD-10-CM

## 2014-02-13 DIAGNOSIS — Z79899 Other long term (current) drug therapy: Secondary | ICD-10-CM | POA: Diagnosis not present

## 2014-02-13 DIAGNOSIS — S93401A Sprain of unspecified ligament of right ankle, initial encounter: Secondary | ICD-10-CM

## 2014-02-13 DIAGNOSIS — F329 Major depressive disorder, single episode, unspecified: Secondary | ICD-10-CM | POA: Diagnosis not present

## 2014-02-13 DIAGNOSIS — Y9229 Other specified public building as the place of occurrence of the external cause: Secondary | ICD-10-CM | POA: Diagnosis not present

## 2014-02-13 DIAGNOSIS — M62838 Other muscle spasm: Secondary | ICD-10-CM

## 2014-02-13 DIAGNOSIS — D649 Anemia, unspecified: Secondary | ICD-10-CM | POA: Diagnosis not present

## 2014-02-13 DIAGNOSIS — S8990XA Unspecified injury of unspecified lower leg, initial encounter: Secondary | ICD-10-CM | POA: Insufficient documentation

## 2014-02-13 DIAGNOSIS — M25461 Effusion, right knee: Secondary | ICD-10-CM

## 2014-02-13 DIAGNOSIS — W19XXXA Unspecified fall, initial encounter: Secondary | ICD-10-CM

## 2014-02-13 DIAGNOSIS — Y9389 Activity, other specified: Secondary | ICD-10-CM | POA: Insufficient documentation

## 2014-02-13 DIAGNOSIS — W010XXA Fall on same level from slipping, tripping and stumbling without subsequent striking against object, initial encounter: Secondary | ICD-10-CM | POA: Insufficient documentation

## 2014-02-13 DIAGNOSIS — S0993XA Unspecified injury of face, initial encounter: Secondary | ICD-10-CM | POA: Diagnosis not present

## 2014-02-13 DIAGNOSIS — F3289 Other specified depressive episodes: Secondary | ICD-10-CM | POA: Diagnosis not present

## 2014-02-13 DIAGNOSIS — Z872 Personal history of diseases of the skin and subcutaneous tissue: Secondary | ICD-10-CM | POA: Insufficient documentation

## 2014-02-13 DIAGNOSIS — S93409A Sprain of unspecified ligament of unspecified ankle, initial encounter: Secondary | ICD-10-CM | POA: Insufficient documentation

## 2014-02-13 DIAGNOSIS — S99929A Unspecified injury of unspecified foot, initial encounter: Secondary | ICD-10-CM

## 2014-02-13 MED ORDER — NAPROXEN 500 MG PO TABS: 500.0000 mg | ORAL_TABLET | Freq: Two times a day (BID) | ORAL | Status: DC

## 2014-02-13 MED ORDER — CYCLOBENZAPRINE HCL 10 MG PO TABS: 10.0000 mg | ORAL_TABLET | Freq: Two times a day (BID) | ORAL | Status: DC | PRN

## 2014-02-13 NOTE — ED Provider Notes (Signed)
CSN: [id number]     Arrival date & time 02/13/14  1815 History  This chart was scribed for non-physician practitioner working with Tina Relic, MD, by Tina Stokes ED Scribe. This patient was seen in room [place] and the patient's care was started at 8:49 PM   Chief Complaint  Patient presents with  . Fall   The history is provided by the patient. No language interpreter was used.   HPI Comments: Tina Stokes is a 48 y.o. female who presents to the Emergency Department complaining of right ankle, right knee and neck tightness due to fall that occurred last night while patient was at the grocery store and slipped on water. Patient states that she applied Bengay to knee and ankle last night which improved swelling. Patient states that she noticed more swelling in knee than ankle. She feels approximately the same amount of pain in her right ankle and right knee. She states that she feels shooting pain in the right side of her neck. Patient denies LOC or head impact. She denies associated numbness, tingling or weakness.  Past Medical History  Diagnosis Date  . ANEMIA-NOS   . CONTACT DERMATITIS&OTHER ECZEMA DUE UNSPEC CAUSE   . DEPRESSION   . Acne    Past Surgical History  Procedure Laterality Date  . Tubal ligation  01/1999  . Pelvic laparoscopy     Family History  Problem Relation Age of Onset  . Hypertension Mother   . Hyperlipidemia Mother   . Cancer Father     lung cancer  . Breast cancer Cousin     either 79 or 35  . Asthma Sister   . Hypertension Sister    History  Substance Use Topics  . Smoking status: Never Smoker   . Smokeless tobacco: Never Used     Comment: Married, lives with spouse and 2 kids  . Alcohol Use: Yes     Comment: occassional   OB History   Grav Para Term Preterm Abortions TAB SAB Ect Mult Living   3 2   1     2      Review of Systems  Cardiovascular: Positive for leg swelling.  Musculoskeletal: Positive for arthralgias (right knee  and right ankle), joint swelling (right knee and right ankle) and neck stiffness. Negative for back pain.  Skin: Negative for rash and wound.  Neurological: Negative for weakness and numbness.  All other systems reviewed and are negative.  Allergies  Review of patient's allergies indicates no known allergies.  Home Medications   Prior to Admission medications   Medication Sig Start Date End Date Taking? Authorizing Provider  ALPRAZolam Tina Stokes) 0.25 MG tablet TAKE 1 TABLET BY MOUTH EVERY DAY AS NEEDED    Tina Cote, NP  cyclobenzaprine (FLEXERIL) 10 MG tablet Take 1 tablet (10 mg total) by mouth 2 (two) times daily as needed for muscle spasms. 02/13/14   Tina Apple Marilu Favre, PA-C  DIFLUCAN 150 MG tablet Take 1 tablet (150 mg total) by mouth once. 01/24/14   Tina Cote, NP  IRON PO Take by mouth. Not daily    Historical Provider, MD  naproxen (NAPROSYN) 500 MG tablet Take 1 tablet (500 mg total) by mouth 2 (two) times daily. 02/13/14   Tina Corvin Marilu Favre, PA-C  pregabalin (LYRICA) 100 MG capsule Take 1 tablet in the morning and 2 tablets at bedtime. 02/12/14   Tina Keith Rake, DO  terconazole (TERAZOL 7) 0.4 % vaginal cream Place 1 applicator vaginally at  bedtime. 01/24/14   Tina Cote, NP   Triage Vitals: BP 119/80  Pulse 68  Temp(Src) 98.2 F (36.8 C) (Oral)  Resp 16  SpO2 100%  LMP 01/25/2014  Physical Exam  Nursing note and vitals reviewed. Constitutional: She appears well-developed and well-nourished.  HENT:  Head: Normocephalic and atraumatic.  Eyes: Conjunctivae are normal. Right eye exhibits no discharge. Left eye exhibits no discharge.  Neck: Full passive range of motion without pain. Muscular tenderness present. No spinous process tenderness present. No rigidity. No erythema and normal range of motion present.    Pulmonary/Chest: Effort normal. No respiratory distress.  Musculoskeletal:       Right knee: She exhibits swelling and effusion. She exhibits normal range of  motion, no ecchymosis, no deformity, no laceration, no erythema, normal alignment and no bony tenderness. Tenderness found. Patellar tendon tenderness noted.       Right ankle: She exhibits normal range of motion, no swelling, no ecchymosis, no deformity, no laceration and normal pulse. Tenderness (talofibular ligament). Achilles tendon normal.  Strong pedal pulse  Neurological: She is alert. Coordination normal.  Skin: Skin is warm and dry. No rash noted. She is not diaphoretic. No erythema.  Psychiatric: She has a normal mood and affect.   ED Course  Procedures (including critical care time)  DIAGNOSTIC STUDIES: Oxygen Saturation is 100% on RA, normal by my interpretation.    COORDINATION OF CARE: 8:54 PM- Discussed plans to order diagnostic imaging of right knee. Will give patient antiinflammatory and muscle relaxer medication for pain management. Pt advised of plan for treatment and pt agrees.  Labs Review Labs Reviewed - No data to display  Imaging Review Dg Knee Complete 4 Views Right  02/13/2014   CLINICAL DATA:  Fall  EXAM: RIGHT KNEE - COMPLETE 4+ VIEW  COMPARISON:  None available.  FINDINGS: There is an age-indeterminate linear lucency traversing the inferior pole of the patella, which may represent an acute nondisplaced fracture. No significant overlying soft tissue swelling. There is trace joint effusion within the suprapatellar recess.  Distal femur and proximal tibia are intact. Proximal fibula is intact. Joint space remains normal alignment.  IMPRESSION: Age-indeterminate lucency traversing the inferior pole of the patella. Finding may be subacute to chronic in nature given the relative lack of overlying soft tissue swelling and relatively small/ trace joint effusion. Possible acute nondisplaced fracture not entirely excluded however, correlation with physical exam recommended.   Electronically Signed   By: Jeannine Boga M.D.   On: 02/13/2014 22:07     EKG  Interpretation None     MDM   Final diagnoses:  Fall, initial encounter  Muscle spasm  Knee effusion, right  Ankle sprain, right, initial encounter   Xray showed possible concern of mid patella. I re-evaluated the patients knee and she has no pain or tenderness to palpation in area of concern. Did discuss with her follow-up with ortho, effusion although small to knee joint. Pt declines leg sleeve.  48 y.o.Tina Stokes's evaluation in the Emergency Department is complete. It has been determined that no acute conditions requiring further emergency intervention are present at this time. The patient/guardian have been advised of the diagnosis and plan. We have discussed signs and symptoms that warrant return to the ED, such as changes or worsening in symptoms.  Vital signs are stable at discharge. Filed Vitals:   02/13/14 1856  BP: 119/80  Pulse: 68  Temp: 98.2 F (36.8 C)  Resp: 16    Patient/guardian  has voiced understanding and agreed to follow-up with the PCP or specialist.   I personally performed the services described in this documentation, which was scribed in my presence. The recorded information has been reviewed and is accurate.   Linus Mako, PA-C 02/13/14 2217

## 2014-02-13 NOTE — ED Notes (Signed)
Pt presents with c/o fall that occurred last night. Pt reports that she slipped but caught herself from hitting the ground. Pt reports right ankle and right knee pain and also c/o neck tightness, believes she may have stiffened up while she was trying to catch herself.

## 2014-02-13 NOTE — Discharge Instructions (Signed)
Ankle Sprain An ankle sprain is an injury to the strong, fibrous tissues (ligaments) that hold the bones of your ankle joint together.  CAUSES An ankle sprain is usually caused by a fall or by twisting your ankle. Ankle sprains most commonly occur when you step on the outer edge of your foot, and your ankle turns inward. People who participate in sports are more prone to these types of injuries.  SYMPTOMS  Knee Effusion  Knee effusion means you have fluid in your knee. The knee may be more difficult to bend and move. HOME CARE  Use crutches or a brace as told by your doctor.  Put ice on the injured area.  Put ice in a plastic bag.  Place a towel between your skin and the bag.  Leave the ice on for 15-20 minutes, 03-04 times a day.  Raise (elevate) your knee as much as possible.  Only take medicine as told by your doctor.  You may need to do strengthening exercises. Ask your doctor.  Continue with your normal diet and activities as told by your doctor. GET HELP RIGHT AWAY IF:  You have more puffiness (swelling) in your knee.  You see redness, puffiness, or have more pain in your knee.  You have a temperature by mouth above 102 F (38.9 C).  You get a rash.  You have trouble breathing.  You have a reaction to any medicine you are taking.  You have a lot of pain when you move your knee. MAKE SURE YOU:  Understand these instructions.  Will watch your condition.  Will get help right away if you are not doing well or get worse. Document Released: 06/18/2010 Document Revised: 08/08/2011 Document Reviewed: 06/18/2010 [facility] Patient Information 2015 [place], [place]. This information is not intended to replace advice given to you by your health care provider. Make sure you discuss any questions you have with your health care provider.   Pain in your ankle. The pain may be present at rest or only when you are trying to stand or walk.  Swelling.  Bruising. Bruising may  develop immediately or within 1 to 2 days after your injury.  Difficulty standing or walking, particularly when turning corners or changing directions. DIAGNOSIS  Your caregiver will ask you details about your injury and perform a physical exam of your ankle to determine if you have an ankle sprain. During the physical exam, your caregiver will press on and apply pressure to specific areas of your foot and ankle. Your caregiver will try to move your ankle in certain ways. An X-ray exam may be done to be sure a bone was not broken or a ligament did not separate from one of the bones in your ankle (avulsion fracture).  TREATMENT  Certain types of braces can help stabilize your ankle. Your caregiver can make a recommendation for this. Your caregiver may recommend the use of medicine for pain. If your sprain is severe, your caregiver may refer you to a surgeon who helps to restore function to parts of your skeletal system (orthopedist) or a physical therapist. [place] ice to your injury for 1-2 days or as directed by your caregiver. Applying ice helps to reduce inflammation and pain.  Put ice in a plastic bag.  Place a towel between your skin and the bag.  Leave the ice on for 15-20 minutes at a time, every 2 hours while you are awake.  Only take over-the-counter or prescription medicines for  pain, discomfort, or fever as directed by your caregiver.  Elevate your injured ankle above the level of your heart as much as possible for 2-3 days.  If your caregiver recommends crutches, use them as instructed. Gradually put weight on the affected ankle. Continue to use crutches or a cane until you can walk without feeling pain in your ankle.  If you have a plaster splint, wear the splint as directed by your caregiver. Do not rest it on anything harder than a pillow for the first 24 hours. Do not put weight on it. Do not get it wet. You may take it off to take a shower or  bath.  You may have been given an elastic bandage to wear around your ankle to provide support. If the elastic bandage is too tight (you have numbness or tingling in your foot or your foot becomes cold and blue), adjust the bandage to make it comfortable.  If you have an air splint, you may blow more air into it or let air out to make it more comfortable. You may take your splint off at night and before taking a shower or bath. Wiggle your toes in the splint several times per day to decrease swelling. SEEK MEDICAL CARE IF:   You have rapidly increasing bruising or swelling.  Your toes feel extremely cold or you lose feeling in your foot.  Your pain is not relieved with medicine. SEEK IMMEDIATE MEDICAL CARE IF:  Your toes are numb or blue.  You have severe pain that is increasing. MAKE SURE YOU:   Understand these instructions.  Will watch your condition.  Will get help right away if you are not doing well or get worse. Document Released: 05/16/2005 Document Revised: 02/08/2012 Document Reviewed: 05/28/2011 [facility] Patient Information 2015 [place], [place]. This information is not intended to replace advice given to you by your health care provider. Make sure you discuss any questions you have with your health care provider.  Cervical Sprain A cervical sprain is an injury in the neck in which the strong, fibrous tissues (ligaments) that connect your neck bones stretch or tear. Cervical sprains can range from mild to severe. Severe cervical sprains can cause the neck vertebrae to be unstable. This can lead to damage of the spinal cord and can result in serious nervous system problems. The amount of time it takes for a cervical sprain to get better depends on the cause and extent of the injury. Most cervical sprains heal in 1 to 3 weeks. CAUSES  Severe cervical sprains may be caused by:   Contact sport injuries (such as from football, rugby, wrestling, hockey, auto racing, gymnastics,  diving, martial arts, or boxing).   Motor vehicle collisions.   Whiplash injuries. This is an injury from a sudden forward and backward whipping movement of the head and neck.  Falls.  Mild cervical sprains may be caused by:   Being in an awkward position, such as while cradling a telephone between your ear and shoulder.   Sitting in a chair that does not offer proper support.   Working at a poorly Landscape architect station.   Looking up or down for long periods of time.  SYMPTOMS   Pain, soreness, stiffness, or a burning sensation in the front, back, or sides of the neck. This discomfort may develop immediately after the injury or slowly, 24 hours or more after the injury.   Pain or tenderness directly in the middle of the back of the neck.  Shoulder or upper back pain.   Limited ability to move the neck.   Headache.   Dizziness.   Weakness, numbness, or tingling in the hands or arms.   Muscle spasms.   Difficulty swallowing or chewing.   Tenderness and swelling of the neck.  DIAGNOSIS  Most of the time your health care provider can diagnose a cervical sprain by taking your history and doing a physical exam. Your health care provider will ask about previous neck injuries and any known neck problems, such as arthritis in the neck. X-rays may be taken to find out if there are any other problems, such as with the bones of the neck. Other tests, such as a CT scan or MRI, may also be needed.  TREATMENT  Treatment depends on the severity of the cervical sprain. Mild sprains can be treated with rest, keeping the neck in place (immobilization), and pain medicines. Severe cervical sprains are immediately immobilized. Further treatment is done to help with pain, muscle spasms, and other symptoms and may include:  Medicines, such as pain relievers, numbing medicines, or muscle relaxants.   Physical therapy. This may involve stretching exercises, strengthening  exercises, and posture training. Exercises and improved posture can help stabilize the neck, strengthen muscles, and help stop symptoms from returning.  HOME CARE INSTRUCTIONS   Put ice on the injured area.   Put ice in a plastic bag.   Place a towel between your skin and the bag.   Leave the ice on for 15-20 minutes, 3-4 times a day.   If your injury was severe, you may have been given a cervical collar to wear. A cervical collar is a two-piece collar designed to keep your neck from moving while it heals.  Do not remove the collar unless instructed by your health care provider.  If you have long hair, keep it outside of the collar.  Ask your health care provider before making any adjustments to your collar. Minor adjustments may be required over time to improve comfort and reduce pressure on your chin or on the back of your head.  Ifyou are allowed to remove the collar for cleaning or bathing, follow your health care provider's instructions on how to do so safely.  Keep your collar clean by wiping it with mild soap and water and drying it completely. If the collar you have been given includes removable pads, remove them every 1-2 days and hand wash them with soap and water. Allow them to air dry. They should be completely dry before you wear them in the collar.  If you are allowed to remove the collar for cleaning and bathing, wash and dry the skin of your neck. Check your skin for irritation or sores. If you see any, tell your health care provider.  Do not drive while wearing the collar.   Only take over-the-counter or prescription medicines for pain, discomfort, or fever as directed by your health care provider.   Keep all follow-up appointments as directed by your health care provider.   Keep all physical therapy appointments as directed by your health care provider.   Make any needed adjustments to your workstation to promote good posture.   Avoid positions and  activities that make your symptoms worse.   Warm up and stretch before being active to help prevent problems.  SEEK MEDICAL CARE IF:   Your pain is not controlled with medicine.   You are unable to decrease your pain medicine over time as planned.  Your activity level is not improving as expected.  SEEK IMMEDIATE MEDICAL CARE IF:   You develop any bleeding.  You develop stomach upset.  You have signs of an allergic reaction to your medicine.   Your symptoms get worse.   You develop new, unexplained symptoms.   You have numbness, tingling, weakness, or paralysis in any part of your body.  MAKE SURE YOU:   Understand these instructions.  Will watch your condition.  Will get help right away if you are not doing well or get worse. Document Released: 03/13/2007 Document Revised: 05/21/2013 Document Reviewed: 11/21/2012 [facility] Patient Information 2015 [place], [place]. This information is not intended to replace advice given to you by your health care provider. Make sure you discuss any questions you have with your health care provider.

## 2014-02-13 NOTE — Progress Notes (Signed)
Orthopedic Tech Progress Note Patient Details:  Tina Stokes 06/02/1965 [id number] Applied ASO to RLE.  Pulses, sensation, motion intact before and after application. Capillary refill less than 2 seconds before and after application. Ortho Devices Type of Ortho Device: ASO Ortho Device/Splint Location: RLE Ortho Device/Splint Interventions: Application   Darrol Poke 02/13/2014, 9:47 PM

## 2014-02-17 ENCOUNTER — Telehealth: Payer: Self-pay | Admitting: Family

## 2014-02-17 DIAGNOSIS — M25461 Effusion, right knee: Secondary | ICD-10-CM

## 2014-02-17 DIAGNOSIS — S93401A Sprain of unspecified ligament of right ankle, initial encounter: Secondary | ICD-10-CM

## 2014-02-17 NOTE — Telephone Encounter (Signed)
Referral placed.

## 2014-02-17 NOTE — Telephone Encounter (Signed)
Pt called to say that she fell and went to ER on 02/13/14. She said she sprang her ankle and has a knee contusion . Pt said she told by ER that she need to see a orthopedic doctor. Pt requesting a referral to see orthopedic doctor Melrose Nakayama .

## 2014-02-19 ENCOUNTER — Telehealth: Payer: Self-pay | Admitting: Family

## 2014-02-19 NOTE — Telephone Encounter (Signed)
Pt states she needs referral to dr patel for her visit on 8/4.  Pt thinks she called as well for this, but they did not receive. This was a fu visit w/ dr patel hwho she sees regularly. 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 Pt states her old card states [employer]. Pt cannot go to ortho referral until this is done [phone] Pt would like a cb if you have any questions.

## 2014-02-19 NOTE — Telephone Encounter (Signed)
done

## 2014-02-26 NOTE — ED Provider Notes (Signed)
Medical screening examination/treatment/procedure(s) were performed by non-physician practitioner and as supervising physician I was immediately available for consultation/collaboration.   EKG Interpretation None       Babette Relic, MD 02/26/14 1328

## 2014-03-31 ENCOUNTER — Encounter (HOSPITAL_COMMUNITY): Payer: Self-pay | Admitting: Emergency Medicine

## 2014-06-12 ENCOUNTER — Telehealth: Payer: Self-pay | Admitting: *Deleted

## 2014-06-12 MED ORDER — GABAPENTIN 100 MG PO CAPS: 100.0000 mg | ORAL_CAPSULE | Freq: Three times a day (TID) | ORAL | Status: DC

## 2014-06-12 NOTE — Telephone Encounter (Signed)
Patient called requesting samples of Lyrica.  She takes 100 mg tid.  I told her that I did not think that we had enough samples to last her very long.  Her Rx is going to cost $600 so I asked if she had tried anything else in the past.  She said that she has not, so can we change her Rx to something cheaper?  Please advise.

## 2014-06-12 NOTE — Telephone Encounter (Signed)
Patient notified and given instructions per Dr. Posey Pronto.

## 2014-06-12 NOTE — Addendum Note (Signed)
Addended by: Alda Berthold on: 06/12/2014 04:12 PM   Modules accepted: Orders, Medications

## 2014-06-12 NOTE — Telephone Encounter (Signed)
Pls inform patient we can try neurontin 100mg  TID.  Side effects are similar, including sedation, but this is a very low dose.  She can start taking one tablet at bedtime and see how she does before increasing it to three times daily.  I'll send it to her pharmacy.  Lavan Imes K. Posey Pronto, DO

## 2014-07-02 ENCOUNTER — Telehealth: Payer: Self-pay | Admitting: Neurology

## 2014-07-02 NOTE — Telephone Encounter (Signed)
Pt called to cancel her F/U for 07/07/14. Pt has to work and will call later to r/s.

## 2014-07-07 ENCOUNTER — Ambulatory Visit: Payer: 59 | Admitting: Neurology

## 2014-07-09 ENCOUNTER — Telehealth: Payer: Self-pay | Admitting: Family

## 2014-07-09 DIAGNOSIS — G5139 Clonic hemifacial spasm, unspecified: Secondary | ICD-10-CM

## 2014-07-09 NOTE — Telephone Encounter (Signed)
Pt will fax copy of ins card

## 2014-07-09 NOTE — Telephone Encounter (Signed)
Pt would like a referral to dr patel neurologist. Pt has [employer] ins. Pt is aware last vist to NP in 2014

## 2014-07-10 NOTE — Telephone Encounter (Signed)
Pt has appt 07-11-14 with dr patel . Dx facial nerves spasms. Can we do referral. I received ins card

## 2014-07-10 NOTE — Telephone Encounter (Signed)
Referral placed.

## 2014-07-11 ENCOUNTER — Ambulatory Visit (INDEPENDENT_AMBULATORY_CARE_PROVIDER_SITE_OTHER): Payer: 59 | Admitting: Neurology

## 2014-07-11 ENCOUNTER — Encounter: Payer: Self-pay | Admitting: Neurology

## 2014-07-11 ENCOUNTER — Other Ambulatory Visit: Payer: Self-pay

## 2014-07-11 VITALS — BP 110/78 | HR 73 | Ht 61.0 in | Wt 145.4 lb

## 2014-07-11 DIAGNOSIS — R238 Other skin changes: Secondary | ICD-10-CM

## 2014-07-11 DIAGNOSIS — L439 Lichen planus, unspecified: Secondary | ICD-10-CM

## 2014-07-11 DIAGNOSIS — L659 Nonscarring hair loss, unspecified: Secondary | ICD-10-CM

## 2014-07-11 DIAGNOSIS — M792 Neuralgia and neuritis, unspecified: Secondary | ICD-10-CM

## 2014-07-11 DIAGNOSIS — L989 Disorder of the skin and subcutaneous tissue, unspecified: Secondary | ICD-10-CM

## 2014-07-11 NOTE — Progress Notes (Signed)
Follow-up Visit   Date: 07/11/2014    Tina Stokes MRN: [id number] DOB: 1966-05-26   Interim History: Tina Stokes is a 49 y.o. left-handed African American female with history of depression and contact dermatitis returning to the clinic for follow-up of scalp discomfort.    History of present illness: Since mid-2000s, she has been having uncontrollable itching and inflamed scalp (vertex of head). She was given steroids and neurontin which did not help. 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 About 5-years ago, she had a MRI of the brain which did not show any intracranial process. She saw a neurologist at [place].   In 2013, she started noticing left sided facial spasm which started intermittently, but in the fall of 2014, it starting occuring 2-3 times per week and slowly improved. She says that her left eye brow will go up, lasting only a few seconds. There is no pain, but it feels "tight" as if the muscle is contracting. There is no associated pulling of the cheek, lip, or jaw. No prior history of Bell's palsy.   Follow-up 09/26/2013:  She initially saw me for facial spasm which has since resolved.  For and paresthesias EMG of the upper extremities was performed which showed bilateral median neuropathy, very mild in degree electrically. She continues to have scalp discomfort and was started on Lyrica 143m BID which seems to help but she continues to have some baseline discomfort.   UPDATE 12/31/2013:  She remains relatively unchanged and still has itching and intermittent burning of the scalp.  She continues to take Lyrica 1040mand 20030mt bedtime which is controlling symptoms relatively well, but due to insurance reasons it was stopped.    UPDATE 07/11/2014:  She reports having worsening scalp pain last week and had more hair breakage/loss.  Upon further  questioning, she states that at one point she saw rheumatology who empirically treated her with prednisone and while taking this, she was doing well, but as soon as it was discontinued, symptoms returned.  Additionally, she was also briefly tried on a chemotherapeutic agent (?methotrexate) [place] ScaKeene Breath Medications:  Current Outpatient Prescriptions on File Prior to Visit  Medication Sig Dispense Refill  . ALPRAZolam (XANAX) 0.25 MG tablet TAKE 1 TABLET BY MOUTH EVERY DAY AS NEEDED 30 tablet 0  . cyclobenzaprine (FLEXERIL) 10 MG tablet Take 1 tablet (10 mg total) by mouth 2 (two) times daily as needed for muscle spasms. 20 tablet 0  . gabapentin (NEURONTIN) 100 MG capsule Take 1 capsule (100 mg total) by mouth 3 (three) times daily. (Patient taking differently: Take 100 mg by mouth 2 (two) times daily. One in the morning and 2 qhs) 90 capsule 5  . IRON PO Take by mouth. Not daily     No current facility-administered medications on file prior to visit.    Allergies:  No Known Allergies   Review of Systems:  CONSTITUTIONAL: No fevers, chills, night sweats, or weight loss.   EYES: No visual changes or eye pain ENT: No hearing changes.  No history of nose bleeds.   RESPIRATORY: No cough, wheezing and shortness of breath.   CARDIOVASCULAR: Negative for chest pain, and palpitations.   GI: Negative for abdominal discomfort, blood in stools or black stools.  No recent change in bowel habits.   GU:  No history of incontinence.   MUSCLOSKELETAL:  No history of joint pain or swelling.  No myalgias.   SKIN: Negative for lesions, rash, and itching.   ENDOCRINE: Negative for cold or heat intolerance, polydipsia or goiter.   PSYCH:  No depression or anxiety symptoms.   NEURO: As Above.   Vital Signs:  BP 110/78 mmHg  Pulse 73  Ht '5\' 1"'  (1.549 m)  Wt 145 lb 6 oz (65.942 kg)  BMI 27.48 kg/m2  SpO2 99%   Neurological Exam: MENTAL STATUS including orientation to  time, place, person is normal.   There is evidence of hair breakage and thining over the vertex of the head.  CRANIAL NERVES: Pupils equal round and reactive to light.  Face is symmetric.   MOTOR:  Motor strength is 5/5 in all extremities.  SENSORY:  Intact to vibration and pinprick.  COORDINATION/GAIT: Gait is stable.   Data: Labs 07/2012: ESR 10, ANA - neg Lab Results  Component Value Date   VITAMINB12 >1500* 04/15/2013   Labs 09/26/2013:  SPEP/UPEP with IFE, ENA Labs 03/27/2914:  ANA neg  MRI brain with and without contrast 04/15/2013: Normal for age MRI appearance of the brain and no abnormality identified except for trace bilateral mastoid fluid, significance doubtful.  EMG of the upper extremities 05/13/2013: 1. 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  2. There is no evidence of a cervical radiculopathy affecting the right side.    IMPRESSION: ?Burning scalp syndrome vs immune mediated folliculitis.  Patient has about 15 year history of episodic flares of scalp irritation with hair breakage and persistent paresthesias over the scalp. Previously scalp biopsy showed lichen planus, but I'm not sure if this explains her symptoms completely.  Given that she had temporarily relief in the past with corticosteroids, I think it is reasonable to reconsider the possibility of an immune mediated process affecting her scalp.  I am able to control her burning pain with gabapentin, but I still have no answer as to why she is loosing her hair.  I will see if she can be re-evaluated by her dermatologist.  MRI brain is unremarkable.  Autoimmune/inflammatory work-up is negative.  PLAN/RECOMMENDATIONS:  1.  Increase gabapentin to 32m BID 2.  Referral to dermatology for ongoing problems with alopecia and burning scalp pain  3.  Return to clinic in 440-month   The duration of this  appointment visit was 25 minutes of face-to-face time with the patient.  Greater than 50% of this time was spent in counseling, explanation of diagnosis, planning of further management, and coordination of care.   Thank you for allowing me to participate in patient's care.  If I can answer any additional questions, I would be pleased to do so.    Sincerely,    Carmelo Reidel K. PaPosey ProntoDO

## 2014-07-11 NOTE — Patient Instructions (Addendum)
1.  Start taking gabapentin as follows:    AM  PM Week 1  100  300 Week 2  200  300 Week 3  300  300  2.  We will send a referral to Dr. Keene Breath to see you again  3.  Return to clinic in 4 months

## 2014-07-11 NOTE — Progress Notes (Signed)
Done

## 2014-09-22 ENCOUNTER — Telehealth: Payer: Self-pay | Admitting: Neurology

## 2014-09-22 ENCOUNTER — Other Ambulatory Visit: Payer: Self-pay | Admitting: *Deleted

## 2014-09-22 MED ORDER — GABAPENTIN 100 MG PO CAPS: 100.0000 mg | ORAL_CAPSULE | Freq: Two times a day (BID) | ORAL | Status: DC

## 2014-09-22 NOTE — Telephone Encounter (Signed)
Rx sent for correct amount.

## 2014-09-22 NOTE — Telephone Encounter (Signed)
Pt needs to talk to someone about refill on the gabapentin  medication due to the dosage change on medication pt phone number is [phone]

## 2014-09-22 NOTE — Telephone Encounter (Signed)
Called patient back and left message for her to call me back.  

## 2014-10-22 ENCOUNTER — Encounter: Payer: Self-pay | Admitting: Neurology

## 2014-10-22 ENCOUNTER — Ambulatory Visit (INDEPENDENT_AMBULATORY_CARE_PROVIDER_SITE_OTHER): Payer: 59 | Admitting: Neurology

## 2014-10-22 VITALS — BP 100/68 | HR 63 | Ht 61.0 in | Wt 139.6 lb

## 2014-10-22 DIAGNOSIS — L98491 Non-pressure chronic ulcer of skin of other sites limited to breakdown of skin: Secondary | ICD-10-CM

## 2014-10-22 DIAGNOSIS — R238 Other skin changes: Secondary | ICD-10-CM

## 2014-10-22 DIAGNOSIS — M792 Neuralgia and neuritis, unspecified: Secondary | ICD-10-CM | POA: Diagnosis not present

## 2014-10-22 DIAGNOSIS — L989 Disorder of the skin and subcutaneous tissue, unspecified: Secondary | ICD-10-CM | POA: Diagnosis not present

## 2014-10-22 NOTE — Progress Notes (Signed)
Note sent

## 2014-10-22 NOTE — Progress Notes (Signed)
Follow-up Visit   Date: 10/22/2014    Tina Stokes MRN: [id number] DOB: 1966/01/16   Interim History: Tina Stokes is a 49 y.o. left-handed African American female with history of depression and contact dermatitis returning to the clinic for follow-up of scalp discomfort and new complaints of right finger ulceration.    History of present illness: Since mid-2000s, she has been having uncontrollable itching and inflamed scalp (vertex of head). She was given steroids and neurontin which did not help. 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 About 5-years ago, she had a MRI of the brain which did not show any intracranial process. She saw a neurologist at [place].   In 2013, she started noticing left sided facial spasm which started intermittently, but in the fall of 2014, it starting occuring 2-3 times per week and slowly improved. She says that her left eye brow will go up, lasting only a few seconds. There is no pain, but it feels "tight" as if the muscle is contracting. There is no associated pulling of the cheek, lip, or jaw. No prior history of Bell's palsy.   Follow-up 09/26/2013:  She initially saw me for facial spasm which has since resolved.  For and paresthesias EMG of the upper extremities was performed which showed bilateral median neuropathy, very mild in degree electrically. She continues to have scalp discomfort and was started on Lyrica 162m BID which seems to help but she continues to have some baseline discomfort.   UPDATE 12/31/2013:  She remains relatively unchanged and still has itching and intermittent burning of the scalp.  She continues to take Lyrica 108mand 20088mt bedtime which is controlling symptoms relatively well, but due to insurance reasons it was stopped.    UPDATE 07/11/2014:  She reports having worsening scalp pain last week and had  more hair breakage/loss.  Upon further questioning, she states that at one point she saw rheumatology who empirically treated her with prednisone and while taking this, she was doing well, but as soon as it was discontinued, symptoms returned.  Additionally, she was also briefly tried on a chemotherapeutic agent (?methotrexate) [place] ScaKeene BreathUPDATE 10/22/2014:  She is taking gabapentin 200m50m the morning and 300mg12mbedtime, but notices burning sensation starts to worsen early evening. She is seeing her dermatologist and reports that a prednisone taper works wonders for her.  Last week, she was at church and it was very cold, she developed discoloration of her distal middle finger where it went totally white and lasted about 30-minutes.  She has since developed a small ulceration at the finger pad.   Medications:  Current Outpatient Prescriptions on File Prior to Visit  Medication Sig Dispense Refill  . ALPRAZolam (XANAX) 0.25 MG tablet TAKE 1 TABLET BY MOUTH EVERY DAY AS NEEDED 30 tablet 0  . cyclobenzaprine (FLEXERIL) 10 MG tablet Take 1 tablet (10 mg total) by mouth 2 (two) times daily as needed for muscle spasms. 20 tablet 0  . gabapentin (NEURONTIN) 100 MG capsule Take 1 capsule (100 mg total) by mouth 3 (three) times daily. (Patient taking differently: Take 100 mg by mouth 2 (two) times daily. One in the morning and 2 qhs) 90 capsule 5  . IRON PO Take by mouth. Not daily     No current facility-administered medications on file prior to visit.    Allergies:  No Known Allergies   Review of Systems:  CONSTITUTIONAL: No fevers, chills, night sweats, or weight loss.   EYES: No visual changes or eye pain ENT: No hearing changes.  No history of nose bleeds.   RESPIRATORY: No cough, wheezing and shortness of breath.   CARDIOVASCULAR: Negative for chest pain, and palpitations.   GI: Negative for abdominal discomfort, blood in stools or black stools.  No recent  change in bowel habits.   GU:  No history of incontinence.   MUSCLOSKELETAL: No history of joint pain or swelling.  No myalgias.   SKIN: Negative for lesions, rash, and itching.   ENDOCRINE: Negative for cold or heat intolerance, polydipsia or goiter.   PSYCH:  No depression or anxiety symptoms.   NEURO: As Above.   Vital Signs:  BP 100/68 mmHg  Pulse 63  Ht _0  (1.549 m)  Wt 139 lb 9 oz (63.305 kg)  BMI 26.38 kg/m2  SpO2 98%   Neurological Exam: MENTAL STATUS including orientation to time, place, person is normal.   There is evidence of hair breakage and thining over the vertex of the head.  CRANIAL NERVES: Pupils equal round and reactive to light.  Face is symmetric.   MOTOR:  Motor strength is 5/5 in all extremities.  SENSORY:  Intact to vibration and pinprick.  REFLEXES:  2+/4 throughout  COORDINATION/GAIT: Gait is stable.   Data: Labs 07/2012: ESR 10, ANA - neg Lab Results  Component Value Date   VITAMINB12 >1500* 04/15/2013   Labs 09/26/2013:  SPEP/UPEP with IFE, ENA Labs 03/27/2914:  ANA neg  MRI brain with and without contrast 04/15/2013: Normal for age MRI appearance of the brain and no abnormality identified except for trace bilateral mastoid fluid, significance doubtful.  EMG of the upper extremities 05/13/2013: 1. 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  2. There is no evidence of a cervical radiculopathy affecting the right side.    IMPRESSION: ?Burning scalp syndrome vs immune mediated folliculitis.  Patient has about 15 year history of episodic flares of scalp irritation with hair breakage and persistent paresthesias over the scalp. Previous scalp biopsy showed lichen planus.  Given that she had temporarily relief in the past with corticosteroids, I think it is reasonable to reconsider the possibility of an immune mediated process affecting  her scalp. She is being followed by dermatology.  MRI brain is unremarkable.  Autoimmune/inflammatory work-up is negative.  Right finger ulceration, ?Raynaud's phenomenon.   PLAN/RECOMMENDATIONS:  1.  Adjust gabapentin to 200-100-343m 2.  Folllow-up with PCP re: finger discoloration and ulceration 3.  Return to clinic in 6 months   The duration of this appointment visit was 25 minutes of face-to-face time with the patient.  Greater than 50% of this time was spent in counseling, explanation of diagnosis, planning of further management, and coordination of care.   Thank you for allowing me to participate in patient's care.  If I can answer any additional questions, I would be pleased to do so.    Sincerely,    Taylar Hartsough K. PPosey Pronto DO

## 2014-10-22 NOTE — Patient Instructions (Signed)
Adjust gabapentin to 200-100-300mg  Follow-up with your primary care doctor regarding your right finger ulceration and discoloration Return to clinic in 6 months

## 2014-10-23 ENCOUNTER — Encounter: Payer: Self-pay | Admitting: Family Medicine

## 2014-10-23 ENCOUNTER — Ambulatory Visit (INDEPENDENT_AMBULATORY_CARE_PROVIDER_SITE_OTHER): Payer: 59 | Admitting: Family Medicine

## 2014-10-23 VITALS — BP 120/74 | Temp 98.0°F | Wt 144.8 lb

## 2014-10-23 DIAGNOSIS — M2550 Pain in unspecified joint: Secondary | ICD-10-CM

## 2014-10-23 DIAGNOSIS — M6250 Muscle wasting and atrophy, not elsewhere classified, unspecified site: Secondary | ICD-10-CM | POA: Diagnosis not present

## 2014-10-23 DIAGNOSIS — I7300 Raynaud's syndrome without gangrene: Secondary | ICD-10-CM

## 2014-10-23 DIAGNOSIS — M25572 Pain in left ankle and joints of left foot: Secondary | ICD-10-CM | POA: Diagnosis not present

## 2014-10-23 NOTE — Progress Notes (Signed)
Pre visit review using our clinic review tool, if applicable. No additional management support is needed unless otherwise documented below in the visit note.  HPI:  Tina Stokes is a 49 yo prior pt of [facility] Quint, here for an acute visit for numerous complaints:  Finger lesion: -PMH sig for chronic skin rash of scalp - followed by Derm at [facility], on prednisone with rheum in the past and chemotherapeutic agent with dermatologist in [place] in the past -here today for ulcerated skin lesion on finger - per neurology notes ? Raynauds, immune mediated process. Neg autoimmune.imflam workup per [facility]. -reports: has had this lesion for 3 weeks, 3rd digit R hand, now healing, was an ulcer, reports when gets cold this finger has always turned white, no other issues with other fingers or blue color to hands -she is concerned for raynauds -denies: fevers, chills, malaise, circulation issues elsewhere  Poyarthralgia: -knees and ankles, most recently intermittent pain and swelling in L ankle -doing PT with ortho for knee issues -denies: weakness, numbness, trauma, redness when swells, fevers, malaise, weight loss  Muscle atrophy: -R lateral calf -doe snot know how long this has been there -reports her physical therapist noticed this -no pain or weakness, no fasciculation, no atrophy elsewhere   ROS: See pertinent positives and negatives per HPI.  Past Medical History  Diagnosis Date  . ANEMIA-NOS   . CONTACT DERMATITIS&OTHER ECZEMA DUE UNSPEC CAUSE   . DEPRESSION   . Acne     Past Surgical History  Procedure Laterality Date  . Tubal ligation  01/1999  . Pelvic laparoscopy      Family History  Problem Relation Age of Onset  . Hypertension Mother   . Hyperlipidemia Mother   . Cancer Father     lung cancer  . Breast cancer Cousin     either 4 or 32  . Asthma Sister   . Hypertension Sister     History   Social History  . Marital Status: Married    Spouse Name: N/A  .  Number of Children: N/A  . Years of Education: N/A   Social History Main Topics  . Smoking status: Never Smoker   . Smokeless tobacco: Never Used     Comment: Married, lives with spouse and 2 kids  . Alcohol Use: 0.0 oz/week    0 Standard drinks or equivalent per week     Comment: occassional  . Drug Use: No  . Sexual Activity:    Partners: Male    Birth Control/ Protection: Surgical   Other Topics Concern  . None   Social History Narrative   Works as a Theme park manager.   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    Education: 2 years of college.      Current outpatient prescriptions:  .  gabapentin (NEURONTIN) 100 MG capsule, Take 1 capsule (100 mg total) by mouth 3 (three) times daily. (Patient taking differently: Take 100 mg by mouth 2 (two) times daily. One in the morning and 2 qhs), Disp: 90 capsule, Rfl: 5 .  IRON PO, Take by mouth. Not daily, Disp: , Rfl:  .  SOOLANTRA 1 % CREA, , Disp: , Rfl:  .  TOPICORT 0.25 % LIQD, , Disp: , Rfl:  .  ALPRAZolam (XANAX) 0.25 MG tablet, TAKE 1 TABLET BY MOUTH EVERY DAY AS NEEDED (Patient not taking: Reported on 10/23/2014), Disp: 30 tablet, Rfl: 0 .  CORDRAN 0.05 % lotion, , Disp: , Rfl:  .  cyclobenzaprine (FLEXERIL) 10 MG tablet, Take 1 tablet (10 mg total) by mouth 2 (two) times daily as needed for muscle spasms. (Patient not taking: Reported on 10/23/2014), Disp: 20 tablet, Rfl: 0 .  LYRICA 100 MG capsule, , Disp: , Rfl:  .  predniSONE (DELTASONE) 10 MG tablet, , Disp: , Rfl:   EXAM:  Filed Vitals:   10/23/14 1418  BP: 120/74  Temp: 98 F (36.7 C)    Body mass index is 27.37 kg/(m^2).  GENERAL: vitals reviewed and listed above, alert, oriented, appears well hydrated and in no acute distress  HEENT: atraumatic, conjunttiva clear, no obvious abnormalities on inspection of external nose and ears  NECK: no obvious masses on inspection  LUNGS: clear to auscultation bilaterally, no wheezes, rales or rhonchi, good  air movement  CV: HRRR, no peripheral edema, normal cap refill in fingers, normal radial pulses  SKIN: small scab on 3rd digit R hand about 3 mm in diameter with two small purple scabbed papules adjacent  MS: moves all extremities without noticeable abnormality; posible mild L ankle efusion, no TTP of ankle, foot or leg, normal ant/post drawer, normal pedal pulses, no erythema of ankles, normal sensation and strength in ankle and foot. Small indentation of muscular portion of R anterolat calf in region of tibialis anterior, no TTP, no bony TTP, normal strength in lower ext.  PSYCH: pleasant and cooperative, no obvious depression or anxiety  ASSESSMENT AND PLAN:  Discussed the following assessment and plan:  Possible Raynauds phenomenon - Plan: Ambulatory referral to Rheumatology  Polyarthralgia - Plan: Ambulatory referral to Rheumatology  Muscle atrophy - Plan: Ambulatory referral to Rheumatology  Left ankle pain - Plan: DG Ankle Complete Left  -will get plain film ankle -unsure of cause of skin lesion and she has f/u with derm in 1 week and advised seeing if she can move this appt up so that they can evaluate -given her neurologist concern for raynaud's, polyarthralgia and possible muscle atrophy she opted for eval with rheum for these concerns -follow up as scheduled -Patient advised to return or notify a doctor immediately if symptoms worsen or persist or new concerns arise.  There are no Patient Instructions on file for this visit.   Colin Benton R.

## 2014-10-23 NOTE — Patient Instructions (Signed)
BEFORE YOU LEAVE: -xray sheet -follow up as scheduled  Get the xray of you Left ankle  Please see your dermatologist for evaluation of the skin lesion on your finger  We placed a referral for you as discussed for the joint pains, muscle atrophy and possible raynaud's. It usually takes about 1-2 weeks to process and schedule this referral. If you have not heard from [place] regarding this appointment in 2 weeks please contact our office.

## 2014-11-03 ENCOUNTER — Ambulatory Visit: Payer: 59 | Admitting: Neurology

## 2014-11-10 ENCOUNTER — Ambulatory Visit (INDEPENDENT_AMBULATORY_CARE_PROVIDER_SITE_OTHER): Payer: 59 | Admitting: Family Medicine

## 2014-11-10 ENCOUNTER — Encounter: Payer: Self-pay | Admitting: Family Medicine

## 2014-11-10 VITALS — BP 100/78 | HR 65 | Temp 98.2°F | Ht 61.0 in | Wt 143.2 lb

## 2014-11-10 DIAGNOSIS — R21 Rash and other nonspecific skin eruption: Secondary | ICD-10-CM

## 2014-11-10 DIAGNOSIS — Z1211 Encounter for screening for malignant neoplasm of colon: Secondary | ICD-10-CM

## 2014-11-10 DIAGNOSIS — D649 Anemia, unspecified: Secondary | ICD-10-CM

## 2014-11-10 DIAGNOSIS — Z131 Encounter for screening for diabetes mellitus: Secondary | ICD-10-CM

## 2014-11-10 DIAGNOSIS — Z1239 Encounter for other screening for malignant neoplasm of breast: Secondary | ICD-10-CM

## 2014-11-10 DIAGNOSIS — F39 Unspecified mood [affective] disorder: Secondary | ICD-10-CM

## 2014-11-10 DIAGNOSIS — E785 Hyperlipidemia, unspecified: Secondary | ICD-10-CM | POA: Diagnosis not present

## 2014-11-10 DIAGNOSIS — L708 Other acne: Secondary | ICD-10-CM

## 2014-11-10 LAB — LIPID PANEL
CHOL/HDL RATIO: 2
Cholesterol: 179 mg/dL (ref 0–200)
HDL: 86.5 mg/dL (ref >39.00)
LDL CALC: 81 mg/dL (ref 0–99)
NONHDL: 92.5
Triglycerides: 60 mg/dL (ref 0.0–149.0)
VLDL: 12 mg/dL (ref 0.0–40.0)

## 2014-11-10 LAB — CBC WITH DIFFERENTIAL/PLATELET
BASOS ABS: 0 10*3/uL (ref 0.0–0.1)
Basophils Relative: 0.4 % (ref 0.0–3.0)
Eosinophils Absolute: 0.1 10*3/uL (ref 0.0–0.7)
Eosinophils Relative: 1.7 % (ref 0.0–5.0)
HCT: 38.6 % (ref 36.0–46.0)
HEMOGLOBIN: 13 g/dL (ref 12.0–15.0)
LYMPHS PCT: 44.5 % (ref 12.0–46.0)
Lymphs Abs: 1.8 10*3/uL (ref 0.7–4.0)
MCHC: 33.8 g/dL (ref 30.0–36.0)
MCV: 93.1 fl (ref 78.0–100.0)
MONOS PCT: 8.1 % (ref 3.0–12.0)
Monocytes Absolute: 0.3 10*3/uL (ref 0.1–1.0)
NEUTROS PCT: 45.3 % (ref 43.0–77.0)
Neutro Abs: 1.9 10*3/uL (ref 1.4–7.7)
Platelets: 275 10*3/uL (ref 150.0–400.0)
RBC: 4.15 Mil/uL (ref 3.87–5.11)
RDW: 13.6 % (ref 11.5–15.5)
WBC: 4.1 10*3/uL (ref 4.0–10.5)

## 2014-11-10 LAB — HEMOGLOBIN A1C: Hgb A1c MFr Bld: 5.3 % (ref 4.6–6.5)

## 2014-11-10 NOTE — Progress Notes (Signed)
Pre visit review using our clinic review tool, if applicable. No additional management support is needed unless otherwise documented below in the visit note. 

## 2014-11-10 NOTE — Progress Notes (Addendum)
HPI:  Tina Stokes is here to establish care. At her last visit she was referred to rheumatology for her polyarthralgia and ? Raynauds. Dermatologist said finger lesion was from raynaud's and not treatment advised. She is seeing the rheumatologist at the end of this month. Last PCP and physical: sees Tina Stokes in [place] for her gyn exams.  Has the following chronic problems that require follow up and concerns today:  GAD: -she has xanax from gyn, but has not taken this in 2 years -denies: any hx of suicidal ideation, hospitalization  Dermatitis/Acne: -seeing dermatologist and neurologist in [place] (Tina Stokes) -on gabapentin for this and topicort -chronic itching of the scalp, worse with periods  Anemia: -hx of anemia her whole life -reports prior workup and told to take iron daily and has been on this for > 10 years -her mother and son have "blood disorders", aunt has leukemia -hx fibroid uterus -she wants to see a hematologist as feels she has blood disorder causing the scalp issues  ROS negative for unless reported above: fevers, unintentional weight loss, hearing or vision loss, chest pain, palpitations, struggling to Stokes, hemoptysis, melena, hematochezia, hematuria, falls, loc, si, thoughts of self harm  Past Medical History  Diagnosis Date  . ANEMIA-NOS   . CONTACT DERMATITIS&OTHER ECZEMA DUE UNSPEC CAUSE     of the scalp - sees dermatologist in [place]   . Acne   . Fibroid uterus 07/31/2013    Right anterior fibroid 31 x 21 x 33 mm subserous, intramural 16 x 17 mm.     Past Surgical History  Procedure Laterality Date  . Tubal ligation  01/1999  . Pelvic laparoscopy      Family History  Problem Relation Age of Onset  . Hypertension Mother   . Hyperlipidemia Mother   . Cancer Father     lung cancer  . Breast cancer Cousin     either 76 or 75  . Asthma Sister   . Hypertension Sister     History   Social History  . Marital Status:  Married    Spouse Name: N/A  . Number of Children: N/A  . Years of Education: N/A   Social History Main Topics  . Smoking status: Never Smoker   . Smokeless tobacco: Never Used     Comment: Married, lives with spouse and 2 kids  . Alcohol Use: 0.0 oz/week    0 Standard drinks or equivalent per week     Comment: occassional  . Drug Use: No  . Sexual Activity:    Partners: Male    Birth Control/ Protection: Surgical   Other Topics Concern  . None   Social History Narrative   Works as a Theme park manager.   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    Education: 2 years of college.    Lifestyle: no regular exercise; diet is healthy   Religion: husband is a Theme park manager at [employer]     Current outpatient prescriptions:  .  gabapentin (NEURONTIN) 100 MG capsule, Take 1 capsule (100 mg total) by mouth 3 (three) times daily. (Patient taking differently: Take 100 mg by mouth 2 (two) times daily. One in the morning and 2 qhs), Disp: 90 capsule, Rfl: 5 .  IRON PO, Take by mouth. Not daily, Disp: , Rfl:  .  SOOLANTRA 1 % CREA, , Disp: , Rfl:  .  TOPICORT 0.25 % LIQD, , Disp: , Rfl:  EXAM:  Filed Vitals:   11/10/14 0812  BP: 100/78  Pulse: 65  Temp: 98.2 F (36.8 C)    Body mass index is 27.07 kg/(m^2).  GENERAL: vitals reviewed and listed above, alert, oriented, appears well hydrated and in no acute distress  HEENT: atraumatic, conjunttiva clear, no obvious abnormalities on inspection of external nose and ears  NECK: no obvious masses on inspection  LUNGS: clear to auscultation bilaterally, no wheezes, rales or rhonchi, good air movement  CV: HRRR, no peripheral edema  MS: moves all extremities without noticeable abnormality  PSYCH: pleasant and cooperative, no obvious depression or anxiety  ASSESSMENT AND PLAN:  Discussed the following assessment and plan:  Breast cancer screening - Plan: MM Digital Screening  Colon cancer screening -  Plan: Ambulatory referral to Gastroenterology  Mood disorder  Skin rash - of scalp - seeing dermatiologist, Tina Stokes in [place]  Other acne  Anemia, unspecified anemia type - Plan: CBC with Differential -if abnormal, referral per her request - had pancytopenia on last check in our system  Hyperlipidemia - Plan: Lipid Panel  Diabetes mellitus screening - Plan: Hemoglobin A1c  -We reviewed the PMH, PSH, FH, SH, Meds and Allergies. -We provided refills for any medications we will prescribe as needed. -We addressed current concerns per orders and patient instructions. -We have asked for records for pertinent exams, studies, vaccines and notes from previous providers. -We have advised patient to follow up per instructions below.   -Patient advised to return or notify a doctor immediately if symptoms worsen or persist or new concerns arise.  Patient Instructions  BEFORE YOU LEAVE: -labs -follow up in 1 year  -We placed a referral for you as discussed for the mammogram and the colonoscopy. It usually takes about 1-2 weeks to process and schedule this referral. If you have not heard from [place] regarding this appointment in 2 weeks please contact our office.  We recommend the following healthy lifestyle measures: - eat a healthy diet consisting of lots of vegetables, fruits, beans, nuts, seeds, healthy meats such as white chicken and fish   - avoid fried foods, starches, sweets, fast food, processed foods, sodas, red meet and other fattening foods.  - get a least 150 minutes of aerobic exercise per week.        Tina Stokes R.

## 2014-11-10 NOTE — Patient Instructions (Signed)
BEFORE YOU LEAVE: -labs -follow up in 1 year  -We placed a referral for you as discussed for the mammogram and the colonoscopy. It usually takes about 1-2 weeks to process and schedule this referral. If you have not heard from [place] regarding this appointment in 2 weeks please contact our office.  We recommend the following healthy lifestyle measures: - eat a healthy diet consisting of lots of vegetables, fruits, beans, nuts, seeds, healthy meats such as white chicken and fish   - avoid fried foods, starches, sweets, fast food, processed foods, sodas, red meet and other fattening foods.  - get a least 150 minutes of aerobic exercise per week.

## 2014-11-17 ENCOUNTER — Encounter: Payer: Self-pay | Admitting: Internal Medicine

## 2014-11-18 ENCOUNTER — Telehealth: Payer: Self-pay | Admitting: Neurology

## 2014-11-18 NOTE — Telephone Encounter (Signed)
Called patient and left message for her to call me back.

## 2014-11-18 NOTE — Telephone Encounter (Signed)
She can try over the counter AsperCream which contains numbing ointment.  Has she asked her dermatologist what he recommends?

## 2014-11-18 NOTE — Telephone Encounter (Signed)
Please advise 

## 2014-11-18 NOTE — Telephone Encounter (Signed)
Pt called and wanted to know if there is a numbing cream she can use on her scalp/Dawn  CB#[phone]

## 2014-11-18 NOTE — Telephone Encounter (Signed)
Patient given instructions per Dr. Posey Pronto.

## 2014-11-27 ENCOUNTER — Encounter: Payer: Self-pay | Admitting: Women's Health

## 2014-12-03 ENCOUNTER — Encounter: Payer: Self-pay | Admitting: Women's Health

## 2014-12-19 ENCOUNTER — Encounter: Payer: Self-pay | Admitting: Family Medicine

## 2014-12-25 ENCOUNTER — Ambulatory Visit (AMBULATORY_SURGERY_CENTER): Payer: Self-pay

## 2014-12-25 VITALS — Ht 61.0 in | Wt 142.0 lb

## 2014-12-25 DIAGNOSIS — Z1211 Encounter for screening for malignant neoplasm of colon: Secondary | ICD-10-CM

## 2014-12-25 MED ORDER — NA SULFATE-K SULFATE-MG SULF 17.5-3.13-1.6 GM/177ML PO SOLN: 1.0000 | Freq: Once | ORAL | Status: DC

## 2014-12-25 NOTE — Progress Notes (Signed)
No egg or soy allergies Not on home 02 Hx of post op nausea Pt not taking diet or weight loss drugs Pt requested Suprep due to ins deductible met

## 2014-12-31 ENCOUNTER — Encounter: Payer: Self-pay | Admitting: Women's Health

## 2015-01-05 ENCOUNTER — Other Ambulatory Visit: Payer: Self-pay | Admitting: *Deleted

## 2015-01-05 MED ORDER — GABAPENTIN 100 MG PO CAPS: ORAL_CAPSULE | ORAL | Status: DC

## 2015-01-05 NOTE — Telephone Encounter (Signed)
Patient requesting refill. 

## 2015-01-05 NOTE — Telephone Encounter (Signed)
Rx sent 

## 2015-01-05 NOTE — Telephone Encounter (Signed)
Your last note says she is taking this differently.  I don't see where we have called her to change directions.  Please advise.  Thanks.

## 2015-01-05 NOTE — Telephone Encounter (Signed)
Done

## 2015-01-13 ENCOUNTER — Ambulatory Visit (AMBULATORY_SURGERY_CENTER): Payer: 59 | Admitting: Internal Medicine

## 2015-01-13 ENCOUNTER — Encounter: Payer: Self-pay | Admitting: Internal Medicine

## 2015-01-13 VITALS — BP 113/60 | HR 58 | Temp 98.4°F | Resp 16 | Ht 61.0 in | Wt 142.0 lb

## 2015-01-13 DIAGNOSIS — Z1211 Encounter for screening for malignant neoplasm of colon: Secondary | ICD-10-CM

## 2015-01-13 MED ORDER — SODIUM CHLORIDE 0.9 % IV SOLN: 500.0000 mL | INTRAVENOUS | Status: DC

## 2015-01-13 NOTE — Op Note (Signed)
[facility]  [address] [place], [address]   COLONOSCOPY PROCEDURE REPORT  PATIENT: Tina, Stokes  MR#: [id number] BIRTHDATE: 09/03/1965 , 49  yrs. old GENDER: female ENDOSCOPIST: Gatha Mayer, MD, [facility] PROCEDURE DATE:  01/13/2015 PROCEDURE:   Colonoscopy, screening First Screening Colonoscopy - Avg.  risk and is 50 yrs.  old or older Yes.  Prior Negative Screening - Now for repeat screening. N/A  History of Adenoma - Now for follow-up colonoscopy & has been > or = to 3 yrs.  N/A  Polyps removed today? No Recommend repeat exam, <10 yrs? No ASA CLASS:   Class I INDICATIONS:Screening for colonic neoplasia and Colorectal Neoplasm Risk Assessment for this procedure is average risk. MEDICATIONS: Propofol 175 mg IV and Monitored anesthesia care  DESCRIPTION OF PROCEDURE:   After the risks benefits and alternatives of the procedure were thoroughly explained, informed consent was obtained.  The digital rectal exam revealed no abnormalities of the rectum.   The LB [id number]  endoscope was introduced through the anus and advanced to the cecum, which was identified by both the appendix and ileocecal valve. No adverse events experienced.   The quality of the prep was good.  (MiraLax was used)  The instrument was then slowly withdrawn as the colon was fully examined. Estimated blood loss is zero unless otherwise noted in this procedure report.      COLON FINDINGS: There was severe diverticulosis noted in the left colon.   The examination was otherwise normal.  Retroflexed views revealed no abnormalities. The time to cecum = 3.2 Withdrawal time = 9.0   The scope was withdrawn and the procedure completed. COMPLICATIONS: There were no immediate complications.  ENDOSCOPIC IMPRESSION: 1.   Severe diverticulosis was noted in the left colon 2.   The examination was otherwise normal  RECOMMENDATIONS: Repeat colon screening in 10 years.  2026  eSigned:   Gatha Mayer, MD, [facility] 01/13/2015 3:03 PM   cc: Dr. Colin Benton and The Patient

## 2015-01-13 NOTE — Progress Notes (Signed)
Transferred to recovery room. A/O x3, pleased with MAC.  VSS.  Report to Jane, RN. 

## 2015-01-13 NOTE — Patient Instructions (Addendum)
No polyps were seen. You do have a condition called diverticulosis - common and not usually a problem. Please read the handout provided.  Next routine colon screening in 10 years - 2026.  I appreciate the opportunity to care for you. Gatha Mayer, MD, FACG  YOU HAD AN ENDOSCOPIC PROCEDURE TODAY AT [facility]:   Refer to the procedure report that was given to you for any specific questions about what was found during the examination.  If the procedure report does not answer your questions, please call your gastroenterologist to clarify.  If you requested that your care partner not be given the details of your procedure findings, then the procedure report has been included in a sealed envelope for you to review at your convenience later.  YOU SHOULD EXPECT: Some feelings of bloating in the abdomen. Passage of more gas than usual.  Walking can help get rid of the air that was put into your GI tract during the procedure and reduce the bloating. If you had a lower endoscopy (such as a colonoscopy or flexible sigmoidoscopy) you may notice spotting of blood in your stool or on the toilet paper. If you underwent a bowel prep for your procedure, you may not have a normal bowel movement for a few days.  Please Note:  You might notice some irritation and congestion in your nose or some drainage.  This is from the oxygen used during your procedure.  There is no need for concern and it should clear up in a day or so.  SYMPTOMS TO REPORT IMMEDIATELY:   Following lower endoscopy (colonoscopy or flexible sigmoidoscopy):  Excessive amounts of blood in the stool  Significant tenderness or worsening of abdominal pains  Swelling of the abdomen that is new, acute  Fever of 100F or higher   For urgent or emergent issues, a gastroenterologist can be reached at any hour by calling [phone].   DIET: Your first meal following the procedure should be a small meal and then it is  ok to progress to your normal diet. Heavy or fried foods are harder to digest and may make you feel nauseous or bloated.  Likewise, meals heavy in dairy and vegetables can increase bloating.  Drink plenty of fluids but you should avoid alcoholic beverages for 24 hours.  ACTIVITY:  You should plan to take it easy for the rest of today and you should NOT DRIVE or use heavy machinery until tomorrow (because of the sedation medicines used during the test).    FOLLOW UP: Our staff will call the number listed on your records the next business day following your procedure to check on you and address any questions or concerns that you may have regarding the information given to you following your procedure. If we do not reach you, we will leave a message.  However, if you are feeling well and you are not experiencing any problems, there is no need to return our call.  We will assume that you have returned to your regular daily activities without incident.  If any biopsies were taken you will be contacted by phone or by letter within the next 1-3 weeks.  Please call us at [phone] if you have not heard about the biopsies in 3 weeks.    SIGNATURES/CONFIDENTIALITY: You and/or your care partner have signed paperwork which will be entered into your electronic medical record.  These signatures attest to the fact that that the information above on your After  Visit Summary has been reviewed and is understood.  Full responsibility of the confidentiality of this discharge information lies with you and/or your care-partner.  Diverticulosis information given.

## 2015-01-14 ENCOUNTER — Encounter: Payer: Self-pay | Admitting: Women's Health

## 2015-01-14 ENCOUNTER — Telehealth: Payer: Self-pay | Admitting: *Deleted

## 2015-01-14 ENCOUNTER — Encounter: Payer: 59 | Admitting: Internal Medicine

## 2015-01-14 NOTE — Telephone Encounter (Signed)
  Follow up Call-  Call back number 01/13/2015  Post procedure Call Back phone  # [phone]  Permission to leave phone message Yes     Patient questions:  Do you have a fever, pain , or abdominal swelling? No. Pain Score  0 *  Have you tolerated food without any problems? Yes.    Have you been able to return to your normal activities? Yes.    Do you have any questions about your discharge instructions: Diet   No. Medications  No. Follow up visit  No.  Do you have questions or concerns about your Care? No.  Actions: * If pain score is 4 or above: No action needed, pain <4.

## 2015-01-16 ENCOUNTER — Encounter: Payer: Self-pay | Admitting: Women's Health

## 2015-01-26 ENCOUNTER — Ambulatory Visit: Payer: 59 | Admitting: Internal Medicine

## 2015-01-29 ENCOUNTER — Ambulatory Visit (INDEPENDENT_AMBULATORY_CARE_PROVIDER_SITE_OTHER): Payer: 59 | Admitting: Women's Health

## 2015-01-29 ENCOUNTER — Encounter: Payer: Self-pay | Admitting: Women's Health

## 2015-01-29 VITALS — BP 118/75 | Ht 61.0 in | Wt 140.0 lb

## 2015-01-29 DIAGNOSIS — Z01419 Encounter for gynecological examination (general) (routine) without abnormal findings: Secondary | ICD-10-CM

## 2015-01-29 NOTE — Progress Notes (Signed)
Tina Stokes 11/09/1975 [id number]    History:    Presents for annual exam.  Mostly monthly cycles/BTL. Cycles every 3weeks -3 months in the past year, prior were monthly. Normal Pap and mammogram history. 12/2014 colonoscopy negative polyps, diverticulosis. History of IBS. Labs primary care. Ultrasound 07/2013 for enlarged uterus 3 cm and 2 cm fibroids noted.  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 Mother hypertension and hyperlipidemia, father deceased lung cancer.  ROS:  A ROS was performed and pertinent positives and negatives are included.  Exam:  Filed Vitals:   01/29/15 1404  BP: 118/75    General appearance:  Normal Thyroid:  Symmetrical, normal in size, without palpable masses or nodularity. Respiratory  Auscultation:  Clear without wheezing or rhonchi Cardiovascular  Auscultation:  Regular rate, without rubs, murmurs or gallops  Edema/varicosities:  Not grossly evident Abdominal  Soft,nontender, without masses, guarding or rebound.  Liver/spleen:  No organomegaly noted  Hernia:  None appreciated  Skin  Inspection:  Grossly normal   Breasts: Examined lying and sitting.     Right: Without masses, retractions, discharge or axillary adenopathy.     Left: Without masses, retractions, discharge or axillary adenopathy. Gentitourinary   Inguinal/mons:  Normal without inguinal adenopathy  External genitalia:  Normal  BUS/Urethra/Skene's glands:  Normal  Vagina:  Normal  Cervix:  Normal  Uterus:  8week size, lobular/fibroid.  Midline and mobile  Adnexa/parametria:     Rt: Without masses or tenderness.   Lt: Without masses or tenderness.  Anus and perineum: Normal  Digital rectal exam: Normal sphincter tone without palpated masses or tenderness  Assessment/Plan:  49 y.o. MBF G2P2 for annual exam with no  complaints.  Perimenopausal irregular cycles in the past year/BTL Fibroid uterus Diverticulosis on  screening colonoscopy/2016/asymptomatic Labs primary care  Plan: SBE's, continue annual screening mammogram, calcium rich diet, vitamin D 1000 daily encouraged. Encouraged increased exercise and decrease calories for weight loss. UA, Pap normal 2015, new screening guidelines reviewed.  [facility], 2:39 PM 01/29/2015

## 2015-01-29 NOTE — Patient Instructions (Addendum)
Health Maintenance Adopting a healthy lifestyle and getting preventive care can go a long way to promote health and wellness. Talk with your health care provider about what schedule of regular examinations is right for you. This is a good chance for you to check in with your provider about disease prevention and staying healthy. In between checkups, there are plenty of things you can do on your own. Experts have done a lot of research about which lifestyle changes and preventive measures are most likely to keep you healthy. Ask your health care provider for more information. WEIGHT AND DIET  Eat a healthy diet  Be sure to include plenty of vegetables, fruits, low-fat dairy products, and lean protein.  Do not eat a lot of foods high in solid fats, added sugars, or salt.  Get regular exercise. This is one of the most important things you can do for your health.  Most adults should exercise for at least 150 minutes each week. The exercise should increase your heart rate and make you sweat (moderate-intensity exercise).  Most adults should also do strengthening exercises at least twice a week. This is in addition to the moderate-intensity exercise.  Maintain a healthy weight  Body mass index (BMI) is a measurement that can be used to identify possible weight problems. It estimates body fat based on height and weight. Your health care provider can help determine your BMI and help you achieve or maintain a healthy weight.  For females 61 years of age and older:   A BMI below 18.5 is considered underweight.  A BMI of 18.5 to 24.9 is normal.  A BMI of 25 to 29.9 is considered overweight.  A BMI of 30 and above is considered obese.  Watch levels of cholesterol and blood lipids  You should start having your blood tested for lipids and cholesterol at 50 years of age, then have this test every 5 years.  You may need to have your cholesterol levels checked more often if:  Your lipid or  cholesterol levels are high.  You are older than 50 years of age.  You are at high risk for heart disease.  CANCER SCREENING   Lung Cancer  Lung cancer screening is recommended for adults 77-19 years old who are at high risk for lung cancer because of a history of smoking.  A yearly low-dose CT scan of the lungs is recommended for people who:  Currently smoke.  Have quit within the past 15 years.  Have at least a 30-pack-year history of smoking. A pack year is smoking an average of one pack of cigarettes a day for 1 year.  Yearly screening should continue until it has been 15 years since you quit.  Yearly screening should stop if you develop a health problem that would prevent you from having lung cancer treatment.  Breast Cancer  Practice breast self-awareness. This means understanding how your breasts normally appear and feel.  It also means doing regular breast self-exams. Let your health care provider know about any changes, no matter how small.  If you are in your 20s or 30s, you should have a clinical breast exam (CBE) by a health care provider every 1-3 years as part of a regular health exam.  If you are 15 or older, have a CBE every year. Also consider having a breast X-ray (mammogram) every year.  If you have a family history of breast cancer, talk to your health care provider about genetic screening.  If you are  at high risk for breast cancer, talk to your health care provider about having an MRI and a mammogram every year.  Breast cancer gene (BRCA) assessment is recommended for women who have family members with BRCA-related cancers. BRCA-related cancers include:  Breast.  Ovarian.  Tubal.  Peritoneal cancers.  Results of the assessment will determine the need for genetic counseling and BRCA1 and BRCA2 testing. Cervical Cancer Routine pelvic examinations to screen for cervical cancer are no longer recommended for nonpregnant women who are considered low  risk for cancer of the pelvic organs (ovaries, uterus, and vagina) and who do not have symptoms. A pelvic examination may be necessary if you have symptoms including those associated with pelvic infections. Ask your health care provider if a screening pelvic exam is right for you.   The Pap test is the screening test for cervical cancer for women who are considered at risk.  If you had a hysterectomy for a problem that was not cancer or a condition that could lead to cancer, then you no longer need Pap tests.  If you are older than 65 years, and you have had normal Pap tests for the past 10 years, you no longer need to have Pap tests.  If you have had past treatment for cervical cancer or a condition that could lead to cancer, you need Pap tests and screening for cancer for at least 20 years after your treatment.  If you no longer get a Pap test, assess your risk factors if they change (such as having a new sexual partner). This can affect whether you should start being screened again.  Some women have medical problems that increase their chance of getting cervical cancer. If this is the case for you, your health care provider may recommend more frequent screening and Pap tests.  The human papillomavirus (HPV) test is another test that may be used for cervical cancer screening. The HPV test looks for the virus that can cause cell changes in the cervix. The cells collected during the Pap test can be tested for HPV.  The HPV test can be used to screen women 30 years of age and older. Getting tested for HPV can extend the interval between normal Pap tests from three to five years.  An HPV test also should be used to screen women of any age who have unclear Pap test results.  After 50 years of age, women should have HPV testing as often as Pap tests.  Colorectal Cancer  This type of cancer can be detected and often prevented.  Routine colorectal cancer screening usually begins at 50 years of  age and continues through 50 years of age.  Your health care provider may recommend screening at an earlier age if you have risk factors for colon cancer.  Your health care provider may also recommend using home test kits to check for hidden blood in the stool.  A small camera at the end of a tube can be used to examine your colon directly (sigmoidoscopy or colonoscopy). This is done to check for the earliest forms of colorectal cancer.  Routine screening usually begins at age 50.  Direct examination of the colon should be repeated every 5-10 years through 50 years of age. However, you may need to be screened more often if early forms of precancerous polyps or small growths are found. Skin Cancer  Check your skin from head to toe regularly.  Tell your health care provider about any new moles or changes in   moles, especially if there is a change in a mole's shape or color.  Also tell your health care provider if you have a mole that is larger than the size of a pencil eraser.  Always use sunscreen. Apply sunscreen liberally and repeatedly throughout the day.  Protect yourself by wearing long sleeves, pants, a wide-brimmed hat, and sunglasses whenever you are outside. HEART DISEASE, DIABETES, AND HIGH BLOOD PRESSURE   Have your blood pressure checked at least every 1-2 years. High blood pressure causes heart disease and increases the risk of stroke.  If you are between 75 years and 42 years old, ask your health care provider if you should take aspirin to prevent strokes.  Have regular diabetes screenings. This involves taking a blood sample to check your fasting blood sugar level.  If you are at a normal weight and have a low risk for diabetes, have this test once every three years after 50 years of age.  If you are overweight and have a high risk for diabetes, consider being tested at a younger age or more often. PREVENTING INFECTION  Hepatitis B  If you have a higher risk for  hepatitis B, you should be screened for this virus. You are considered at high risk for hepatitis B if:  You were born in a country where hepatitis B is common. Ask your health care provider which countries are considered high risk.  Your parents were born in a high-risk country, and you have not been immunized against hepatitis B (hepatitis B vaccine).  You have HIV or AIDS.  You use needles to inject street drugs.  You live with someone who has hepatitis B.  You have had sex with someone who has hepatitis B.  You get hemodialysis treatment.  You take certain medicines for conditions, including cancer, organ transplantation, and autoimmune conditions. Hepatitis C  Blood testing is recommended for:  Everyone born from [birth year] through 1966.  Anyone with known risk factors for hepatitis C. Sexually transmitted infections (STIs)  You should be screened for sexually transmitted infections (STIs) including gonorrhea and chlamydia if:  You are sexually active and are younger than 50 years of age.  You are older than 50 years of age and your health care provider tells you that you are at risk for this type of infection.  Your sexual activity has changed since you were last screened and you are at an increased risk for chlamydia or gonorrhea. Ask your health care provider if you are at risk.  If you do not have HIV, but are at risk, it may be recommended that you take a prescription medicine daily to prevent HIV infection. This is called pre-exposure prophylaxis (PrEP). You are considered at risk if:  You are sexually active and do not regularly use condoms or know the HIV status of your partner(s).  You take drugs by injection.  You are sexually active with a partner who has HIV. Talk with your health care provider about whether you are at high risk of being infected with HIV. If you choose to begin PrEP, you should first be tested for HIV. You should then be tested every 3 months for  as long as you are taking PrEP.  PREGNANCY   If you are premenopausal and you may become pregnant, ask your health care provider about preconception counseling.  If you may become pregnant, take 400 to 800 micrograms (mcg) of folic acid every day.  If you want to prevent pregnancy, talk to your  health care provider about birth control (contraception). OSTEOPOROSIS AND MENOPAUSE   Osteoporosis is a disease in which the bones lose minerals and strength with aging. This can result in serious bone fractures. Your risk for osteoporosis can be identified using a bone density scan.  If you are 65 years of age or older, or if you are at risk for osteoporosis and fractures, ask your health care provider if you should be screened.  Ask your health care provider whether you should take a calcium or vitamin D supplement to lower your risk for osteoporosis.  Menopause may have certain physical symptoms and risks.  Hormone replacement therapy may reduce some of these symptoms and risks. Talk to your health care provider about whether hormone replacement therapy is right for you.  HOME CARE INSTRUCTIONS   Schedule regular health, dental, and eye exams.  Stay current with your immunizations.   Do not use any tobacco products including cigarettes, chewing tobacco, or electronic cigarettes.  If you are pregnant, do not drink alcohol.  If you are breastfeeding, limit how much and how often you drink alcohol.  Limit alcohol intake to no more than 1 drink per day for nonpregnant women. One drink equals 12 ounces of beer, 5 ounces of wine, or 1 ounces of hard liquor.  Do not use street drugs.  Do not share needles.  Ask your health care provider for help if you need support or information about quitting drugs.  Tell your health care provider if you often feel depressed.  Tell your health care provider if you have ever been abused or do not feel safe at home. Document Released: 11/29/2010  Document Revised: 09/30/2013 Document Reviewed: 04/17/2013 [employer] Patient Information 2015 [employer]. This information is not intended to replace advice given to you by your health care provider. Make sure you discuss any questions you have with your health care provider. Diverticulosis Diverticulosis is the condition that develops when small pouches (diverticula) form in the wall of your colon. Your colon, or large intestine, is where water is absorbed and stool is formed. The pouches form when the inside layer of your colon pushes through weak spots in the outer layers of your colon. CAUSES  No one knows exactly what causes diverticulosis. RISK FACTORS  Being older than 50. Your risk for this condition increases with age. Diverticulosis is rare in people younger than 40 years. By age 80, almost everyone has it.  Eating a low-fiber diet.  Being frequently constipated.  Being overweight.  Not getting enough exercise.  Smoking.  Taking over-the-counter pain medicines, like aspirin and ibuprofen. SYMPTOMS  Most people with diverticulosis do not have symptoms. DIAGNOSIS  Because diverticulosis often has no symptoms, health care providers often discover the condition during an exam for other colon problems. In many cases, a health care provider will diagnose diverticulosis while using a flexible scope to examine the colon (colonoscopy). TREATMENT  If you have never developed an infection related to diverticulosis, you may not need treatment. If you have had an infection before, treatment may include:  Eating more fruits, vegetables, and grains.  Taking a fiber supplement.  Taking a live bacteria supplement (probiotic).  Taking medicine to relax your colon. HOME CARE INSTRUCTIONS   Drink at least 6-8 glasses of water each day to prevent constipation.  Try not to strain when you have a bowel movement.  Keep all follow-up appointments. If you have had an infection  before:  Increase the fiber in your diet as   directed by your health care provider or dietitian.  Take a dietary fiber supplement if your health care provider approves.  Only take medicines as directed by your health care provider. SEEK MEDICAL CARE IF:   You have abdominal pain.  You have bloating.  You have cramps.  You have not gone to the bathroom in 3 days. SEEK IMMEDIATE MEDICAL CARE IF:   Your pain gets worse.  Yourbloating becomes very bad.  You have a fever or chills, and your symptoms suddenly get worse.  You begin vomiting.  You have bowel movements that are bloody or black. MAKE SURE YOU:  Understand these instructions.  Will watch your condition.  Will get help right away if you are not doing well or get worse. Document Released: 02/11/2004 Document Revised: 05/21/2013 Document Reviewed: 04/10/2013 [facility] Patient Information 2015 [place], [place]. This information is not intended to replace advice given to you by your health care provider. Make sure you discuss any questions you have with your health care provider.

## 2015-01-30 LAB — URINALYSIS W MICROSCOPIC + REFLEX CULTURE
Bacteria, UA: NONE SEEN [HPF]
Bilirubin Urine: NEGATIVE
Casts: NONE SEEN [LPF]
Crystals: NONE SEEN [HPF]
GLUCOSE, UA: NEGATIVE
HGB URINE DIPSTICK: NEGATIVE
Ketones, ur: NEGATIVE
LEUKOCYTES UA: NEGATIVE
NITRITE: NEGATIVE
PH: 6 (ref 5.0–8.0)
Protein, ur: NEGATIVE
Specific Gravity, Urine: 1.019 (ref 1.001–1.035)
YEAST: NONE SEEN [HPF]

## 2015-02-01 LAB — URINE CULTURE: Colony Count: 50000

## 2015-02-26 ENCOUNTER — Ambulatory Visit (INDEPENDENT_AMBULATORY_CARE_PROVIDER_SITE_OTHER): Payer: 59 | Admitting: Family Medicine

## 2015-02-26 ENCOUNTER — Encounter: Payer: Self-pay | Admitting: Family Medicine

## 2015-02-26 VITALS — BP 100/70 | HR 82 | Temp 98.2°F | Ht 61.0 in | Wt 142.2 lb

## 2015-02-26 DIAGNOSIS — Z3200 Encounter for pregnancy test, result unknown: Secondary | ICD-10-CM | POA: Diagnosis not present

## 2015-02-26 DIAGNOSIS — N760 Acute vaginitis: Secondary | ICD-10-CM | POA: Diagnosis not present

## 2015-02-26 LAB — POCT URINE PREGNANCY: PREG TEST UR: NEGATIVE

## 2015-02-26 NOTE — Patient Instructions (Signed)
Can try an over the counter yeast treatment for 1 week while waiting on the lab results.  We have ordered labs or studies at this visit. It can take up to 1-2 weeks for results and processing. We will contact you with instructions IF your results are abnormal. Normal results will be released to your [facility]. If you have not heard from [place] or can not find your results in [facility] in 2 weeks please contact our office.

## 2015-02-26 NOTE — Addendum Note (Signed)
Addended by: Agnes Lawrence on: 02/26/2015 02:27 PM   Modules accepted: Orders

## 2015-02-26 NOTE — Progress Notes (Signed)
HPI:  Vulvovaginitis: -started yesterday -symptoms: mild vulvovaginal pruritis and discharge -denies: fevers, malaise, concern ofr STI, new sexual partners, dysuria, pelvic or abd pain -seeing gyn for irr menstrual bleeding reports told likely perimenopausal, FDLMP 1.5 months ago ROS: See pertinent positives and negatives per HPI.  Past Medical History  Diagnosis Date  . Lichen planus     of the scalp - sees dermatologist in [place]   . Acne   . Fibroid uterus 07/31/2013    Right anterior fibroid 31 x 21 x 33 mm subserous, intramural 16 x 17 mm.   . Raynaud's disease   . Polyarthralgia   . Arthritis     Past Surgical History  Procedure Laterality Date  . Tubal ligation  01/1999  . Pelvic laparoscopy    . Colonoscopy  2006    normal    Family History  Problem Relation Age of Onset  . Hypertension Mother   . Hyperlipidemia Mother   . Cancer Father     lung cancer  . Breast cancer Cousin     either 57 or 57  . Asthma Sister   . Hypertension Sister   . Colon cancer Neg Hx     Social History   Social History  . Marital Status: Married    Spouse Name: N/A  . Number of Children: N/A  . Years of Education: N/A   Social History Main Topics  . Smoking status: Never Smoker   . Smokeless tobacco: Never Used     Comment: Married, lives with spouse and 2 kids  . Alcohol Use: No  . Drug Use: No  . Sexual Activity:    Partners: Male    Birth Control/ Protection: Surgical     Comment: intercourse age 27, sexual partners less than 5   Other Topics Concern  . None   Social History Narrative   Works as a Theme park manager.   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    Education: 2 years of college.    Lifestyle: no regular exercise; diet is healthy   Religion: husband is a Theme park manager at [employer]     Current outpatient prescriptions:  .  gabapentin (NEURONTIN) 100 MG capsule, Take 2 tablets in the morning, 1 tablet in the  afternoon, and 3 tablets at bedtime (Patient taking differently: Take 2 tablets in the morning, 3 tablets at bedtime), Disp: 360 capsule, Rfl: 5 .  IRON PO, Take by mouth. Not daily, Disp: , Rfl:   EXAM:  Filed Vitals:   02/26/15 1358  BP: 100/70  Pulse: 82  Temp: 98.2 F (36.8 C)    Body mass index is 26.88 kg/(m^2).  GENERAL: vitals reviewed and listed above, alert, oriented, appears well hydrated and in no acute distress  HEENT: atraumatic, conjunttiva clear, no obvious abnormalities on inspection of external nose and ears  NECK: no obvious masses on inspection  ABD: BS+, soft, NTTP  GU: normal appearance ext genitalia, white homogenous discharge, no CMT, o/w normal exam  MS: moves all extremities without noticeable abnormality  PSYCH: pleasant and cooperative, no obvious depression or anxiety  ASSESSMENT AND PLAN:  Discussed the following assessment and plan:  Vaginitis and vulvovaginitis  -upreg -yeast, BV, C=GH/Chlam, trich testing pending -Patient advised to return or notify a doctor immediately if symptoms worsen or persist or new concerns arise.  Patient Instructions  Can try an over the counter yeast treatment for 1 week while waiting on the  lab results.  We have ordered labs or studies at this visit. It can take up to 1-2 weeks for results and processing. We will contact you with instructions IF your results are abnormal. Normal results will be released to your [facility]. If you have not heard from [place] or can not find your results in [facility] in 2 weeks please contact our office.            Colin Benton R.

## 2015-02-26 NOTE — Progress Notes (Signed)
Pre visit review using our clinic review tool, if applicable. No additional management support is needed unless otherwise documented below in the visit note. 

## 2015-02-27 ENCOUNTER — Ambulatory Visit (INDEPENDENT_AMBULATORY_CARE_PROVIDER_SITE_OTHER): Payer: 59 | Admitting: Women's Health

## 2015-02-27 ENCOUNTER — Telehealth: Payer: Self-pay | Admitting: Family Medicine

## 2015-02-27 ENCOUNTER — Other Ambulatory Visit (HOSPITAL_COMMUNITY)
Admission: RE | Admit: 2015-02-27 | Discharge: 2015-02-27 | Disposition: A | Discharge status: Home / Self Care | Payer: 59 | Source: Ambulatory Visit | Attending: Family Medicine | Admitting: Family Medicine

## 2015-02-27 ENCOUNTER — Encounter: Payer: Self-pay | Admitting: Women's Health

## 2015-02-27 ENCOUNTER — Other Ambulatory Visit: Payer: Self-pay | Admitting: *Deleted

## 2015-02-27 VITALS — BP 118/80 | Ht 61.0 in | Wt 142.0 lb

## 2015-02-27 DIAGNOSIS — N898 Other specified noninflammatory disorders of vagina: Secondary | ICD-10-CM | POA: Diagnosis not present

## 2015-02-27 DIAGNOSIS — B373 Candidiasis of vulva and vagina: Secondary | ICD-10-CM

## 2015-02-27 DIAGNOSIS — N760 Acute vaginitis: Secondary | ICD-10-CM | POA: Diagnosis present

## 2015-02-27 DIAGNOSIS — Z113 Encounter for screening for infections with a predominantly sexual mode of transmission: Secondary | ICD-10-CM | POA: Insufficient documentation

## 2015-02-27 DIAGNOSIS — R350 Frequency of micturition: Secondary | ICD-10-CM | POA: Diagnosis not present

## 2015-02-27 DIAGNOSIS — B3731 Acute candidiasis of vulva and vagina: Secondary | ICD-10-CM

## 2015-02-27 LAB — URINALYSIS W MICROSCOPIC + REFLEX CULTURE
Bilirubin Urine: NEGATIVE
Casts: NONE SEEN [LPF]
Crystals: NONE SEEN [HPF]
Glucose, UA: NEGATIVE
NITRITE: NEGATIVE
PH: 5.5 (ref 5.0–8.0)
Protein, ur: NEGATIVE
Specific Gravity, Urine: >1.03 (ref 1.001–1.035)

## 2015-02-27 LAB — WET PREP FOR TRICH, YEAST, CLUE
CLUE CELLS WET PREP: NONE SEEN
Trich, Wet Prep: NONE SEEN

## 2015-02-27 MED ORDER — FLUCONAZOLE 150 MG PO TABS: ORAL_TABLET | ORAL | Status: DC

## 2015-02-27 NOTE — Progress Notes (Signed)
Patient ID: Tina Stokes, female   DOB: 17-Dec-1965, 49 y.o.   MRN: [id number] Presents with 3 day history of vaginal itching, white discharge, and odor. Denies abdominal pain, fever, bleeding, urinary symptoms or recent antibiotics. Has not tried any over-the-counter products. Married, BTL, cycles becoming more irregular but mostly monthly. Was seen yesterday at primary care was told it looked like yeast but did not have any test results and would have to wait until available.  Exam: External genitalia normal. Speculum exam reveals erythema at the vaginal walls, copious white discharge, and odor. Wet Pap: Yeast: Moderate, WBC: Few, bacteria: Moderate UA: Negative  Yeast vaginitis  Plan: Prescribed Diflucan 150 mg once, take again in 3 days if symptoms persist.  Discussed yeast prevention. Call if symptoms do not resolve with treatment, or worsen.

## 2015-02-27 NOTE — Telephone Encounter (Signed)
Referral completed for pt to see Dr  Candiss Norse. Ricky Ala, MSN, [place]  : [address], [place], [place] [address]   [phone]   Your referral case information was transmitted on 02/27/2015 at 08:57 AM CDT Your Referral Number is [id number]

## 2015-02-27 NOTE — Patient Instructions (Signed)

## 2015-02-28 LAB — URINE CULTURE

## 2015-03-02 LAB — CERVICOVAGINAL ANCILLARY ONLY
Chlamydia: NEGATIVE
Neisseria Gonorrhea: NEGATIVE
Trichomonas: NEGATIVE

## 2015-03-05 ENCOUNTER — Telehealth: Payer: Self-pay | Admitting: *Deleted

## 2015-03-05 ENCOUNTER — Encounter: Payer: Self-pay | Admitting: Family Medicine

## 2015-03-05 MED ORDER — TERCONAZOLE 0.8 % VA CREA: 1.0000 | TOPICAL_CREAM | Freq: Every day | VAGINAL | Status: DC

## 2015-03-05 NOTE — Telephone Encounter (Signed)
Pt aware Rx sent.  

## 2015-03-05 NOTE — Telephone Encounter (Signed)
Pt was treated with diflucan 150 mg x 1 dose with 1 refill on OV 02/27/15, pt has taken both pill and doesn't feel infection is gone 100%. Pt feel better but irritation still there. Asked if cream could be sent? Please advise

## 2015-03-05 NOTE — Telephone Encounter (Signed)
Okay, please call in Terazol 3 one applicator at bedtime 3

## 2015-03-09 ENCOUNTER — Ambulatory Visit (INDEPENDENT_AMBULATORY_CARE_PROVIDER_SITE_OTHER): Payer: 59 | Admitting: *Deleted

## 2015-03-09 DIAGNOSIS — Z23 Encounter for immunization: Secondary | ICD-10-CM

## 2015-03-09 LAB — CERVICOVAGINAL ANCILLARY ONLY
Bacterial vaginitis: POSITIVE — AB
Candida vaginitis: POSITIVE — AB

## 2015-03-09 MED ORDER — METRONIDAZOLE 500 MG PO TABS: 500.0000 mg | ORAL_TABLET | Freq: Two times a day (BID) | ORAL | Status: DC
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
## 2015-03-09 NOTE — Addendum Note (Signed)
Addended by: Agnes Lawrence on: 03/09/2015 04:52 PM   Modules accepted: Orders

## 2015-03-09 NOTE — Telephone Encounter (Signed)
I called the pt and informed her of the results and she is aware the Rx was sent to her pharmacy.

## 2015-04-27 ENCOUNTER — Ambulatory Visit (INDEPENDENT_AMBULATORY_CARE_PROVIDER_SITE_OTHER): Payer: 59 | Admitting: Family Medicine

## 2015-04-27 ENCOUNTER — Encounter: Payer: Self-pay | Admitting: Family Medicine

## 2015-04-27 ENCOUNTER — Ambulatory Visit: Payer: 59 | Admitting: Neurology

## 2015-04-27 VITALS — BP 102/76 | HR 73 | Temp 98.1°F | Ht 61.0 in | Wt 143.8 lb

## 2015-04-27 DIAGNOSIS — J209 Acute bronchitis, unspecified: Secondary | ICD-10-CM

## 2015-04-27 MED ORDER — BENZONATATE 100 MG PO CAPS
100.0000 mg | ORAL_CAPSULE | Freq: Three times a day (TID) | ORAL | Status: DC | PRN
Start: 2015-04-27 — End: 2015-07-02

## 2015-04-27 MED ORDER — PREDNISONE 20 MG PO TABS: 40.0000 mg | ORAL_TABLET | Freq: Every day | ORAL | Status: DC

## 2015-04-27 NOTE — Progress Notes (Signed)
HPI:  -started: 2-3 weeks ago -symptoms:started as a cold w/ nasal congestion, sore throat, cough - feels "much better" except for persistent cough and some chest tightness when coughs -denies:fever, SOB, NVD, tooth pain -has tried: musinex -sick contacts/travel/risks: denies flu exposure, tick exposure or or Ebola risks  ROS: See pertinent positives and negatives per HPI.  Past Medical History  Diagnosis Date  . Lichen planus     of the scalp - sees dermatologist in [place]   . Acne   . Fibroid uterus 07/31/2013    Right anterior fibroid 31 x 21 x 33 mm subserous, intramural 16 x 17 mm.   . Raynaud's disease   . Polyarthralgia   . Arthritis     Past Surgical History  Procedure Laterality Date  . Tubal ligation  01/1999  . Pelvic laparoscopy    . Colonoscopy  2006    normal    Family History  Problem Relation Age of Onset  . Hypertension Mother   . Hyperlipidemia Mother   . Cancer Father     lung cancer  . Breast cancer Cousin     either 67 or 56  . Asthma Sister   . Hypertension Sister   . Colon cancer Neg Hx     Social History   Social History  . Marital Status: Married    Spouse Name: N/A  . Number of Children: N/A  . Years of Education: N/A   Social History Main Topics  . Smoking status: Never Smoker   . Smokeless tobacco: Never Used     Comment: Married, lives with spouse and 2 kids  . Alcohol Use: No  . Drug Use: No  . Sexual Activity:    Partners: Male    Birth Control/ Protection: Surgical     Comment: intercourse age 11, sexual partners less than 5   Other Topics Concern  . None   Social History Narrative   Works as a Theme park manager.   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    Education: 2 years of college.    Lifestyle: no regular exercise; diet is healthy   Religion: husband is a Theme park manager at [employer]     Current outpatient prescriptions:  .  gabapentin (NEURONTIN) 100 MG capsule,  Take 2 tablets in the morning, 1 tablet in the afternoon, and 3 tablets at bedtime (Patient taking differently: Take 2 tablets in the morning, 3 tablets at bedtime), Disp: 360 capsule, Rfl: 5 .  IRON PO, Take by mouth. Not daily, Disp: , Rfl:  .  terconazole (TERAZOL 3) 0.8 % vaginal cream, Place 1 applicator vaginally at bedtime., Disp: 20 g, Rfl: 0 .  benzonatate (TESSALON PERLES) 100 MG capsule, Take 1 capsule (100 mg total) by mouth 3 (three) times daily as needed for cough., Disp: 20 capsule, Rfl: 0 .  predniSONE (DELTASONE) 20 MG tablet, Take 2 tablets (40 mg total) by mouth daily with breakfast., Disp: 8 tablet, Rfl: 0  EXAM:  Filed Vitals:   04/27/15 1119  BP: 102/76  Pulse: 73  Temp: 98.1 F (36.7 C)    Body mass index is 27.18 kg/(m^2).  GENERAL: vitals reviewed and listed above, alert, oriented, appears well hydrated and in no acute distress  HEENT: atraumatic, conjunttiva clear, no obvious abnormalities on inspection of external nose and ears, normal appearance of ear canals and TMs, clear nasal congestion, mild post oropharyngeal erythema with PND, no tonsillar edema or  exudate, no sinus TTP  NECK: no obvious masses on inspection  LUNGS: clear to auscultation bilaterally, no wheezes, rales or rhonchi, good air movement  CV: HRRR, no peripheral edema  MS: moves all extremities without noticeable abnormality  PSYCH: pleasant and cooperative, no obvious depression or anxiety  ASSESSMENT AND PLAN:  Discussed the following assessment and plan:  Acute bronchitis, unspecified organism  -given HPI and exam findings today, a serious infection or illness is unlikely. We discussed potential etiologies, with VURI being most likely, and advised supportive care and monitoring. We discussed treatment side effects, likely course, antibiotic misuse, transmission, and signs of developing a serious illness. -opted for short course prednisone and cough medication for likely bronchitis  and follow up if any symptoms persist for CXR -of course, we advised to return or notify a doctor immediately if symptoms worsen or persist or new concerns arise.    Patient Instructions  Take the prednisone and cough medication as instructed  Follow up if symptoms worsen, persist or other concerns     Tina Agostino R.

## 2015-04-27 NOTE — Progress Notes (Signed)
Pre visit review using our clinic review tool, if applicable. No additional management support is needed unless otherwise documented below in the visit note. 

## 2015-04-27 NOTE — Patient Instructions (Signed)
Take the prednisone and cough medication as instructed  Follow up if symptoms worsen, persist or other concerns

## 2015-04-29 ENCOUNTER — Telehealth: Payer: Self-pay | Admitting: Family Medicine

## 2015-04-29 NOTE — Telephone Encounter (Signed)
Pt was seen on 11/28 and cough med is not helping and she would like to proceed with getting chest xray

## 2015-04-29 NOTE — Telephone Encounter (Signed)
I called the pt and she stated she has started the Prednisone and she was informed of the message below.

## 2015-04-29 NOTE — Telephone Encounter (Signed)
Has she started the prednisone? Would not expect symptoms to resolve this quickly. If worsening or if persists after completing prednisone would advise CXR.

## 2015-05-19 ENCOUNTER — Other Ambulatory Visit: Payer: Self-pay | Admitting: Orthopedic Surgery

## 2015-05-19 DIAGNOSIS — D2111 Benign neoplasm of connective and other soft tissue of right upper limb, including shoulder: Secondary | ICD-10-CM

## 2015-05-26 ENCOUNTER — Telehealth: Payer: Self-pay | Admitting: *Deleted

## 2015-05-26 MED ORDER — FLUCONAZOLE 150 MG PO TABS: ORAL_TABLET | ORAL | Status: DC

## 2015-05-26 NOTE — Telephone Encounter (Signed)
That would be fine but if it does not clear up she definitely needs to be seen in the office

## 2015-05-26 NOTE — Telephone Encounter (Signed)
Pt aware Rx sent.  

## 2015-05-26 NOTE — Telephone Encounter (Signed)
You are back up MD) pt called c/o recurrent yeast infection itching and white discharge. Pt states nancy normally would prescribed #2 diflucan tablet, asked if you would be willing to do this same? Please advise

## 2015-05-28 ENCOUNTER — Ambulatory Visit
Admission: RE | Admit: 2015-05-28 | Discharge: 2015-05-28 | Disposition: A | Discharge status: Home / Self Care | Payer: 59 | Source: Ambulatory Visit | Attending: Orthopedic Surgery | Admitting: Orthopedic Surgery

## 2015-05-28 ENCOUNTER — Telehealth: Payer: Self-pay | Admitting: Family Medicine

## 2015-05-28 DIAGNOSIS — D2111 Benign neoplasm of connective and other soft tissue of right upper limb, including shoulder: Secondary | ICD-10-CM

## 2015-05-28 MED ORDER — GADOBENATE DIMEGLUMINE 529 MG/ML IV SOLN
12.0000 mL | Freq: Once | INTRAVENOUS | Status: AC | PRN
  Administered 2015-05-28: 12 mL via INTRAVENOUS

## 2015-05-28 NOTE — Telephone Encounter (Signed)
I called the pt and informed her of the message below and she stated she talked with Dr Levell July office and they did a referral.

## 2015-05-28 NOTE — Telephone Encounter (Signed)
Deborah-can you help with this? I think the pt is referring to the MRI by Dr Fredna Dow? Or does the ordering physician need to put this in?

## 2015-05-28 NOTE — Telephone Encounter (Signed)
Tina Stokes called saying she thinks she needs a referral for the MRI today. She wanted to make sure this was taken care of. I told her I see an auth# listed in her file but that I'd relay the message.   Pt's ph# [phone] Thank you.

## 2015-05-28 NOTE — Telephone Encounter (Signed)
If Dr Fredna Dow office is the ordering physician their office will have to do the authorization for that MRI  and the referral .  We can only  do an authorization submission for her to see Dr Fredna Dow  If she already has an appointment  Threw her [facility] that is required by her primary care physician office .

## 2015-07-02 ENCOUNTER — Ambulatory Visit: Payer: Self-pay | Admitting: Family Medicine

## 2015-07-02 ENCOUNTER — Ambulatory Visit (INDEPENDENT_AMBULATORY_CARE_PROVIDER_SITE_OTHER): Payer: BLUE CROSS/BLUE SHIELD | Admitting: Family Medicine

## 2015-07-02 ENCOUNTER — Encounter: Payer: Self-pay | Admitting: Family Medicine

## 2015-07-02 VITALS — BP 100/58 | HR 79 | Temp 97.9°F | Ht 61.0 in | Wt 132.0 lb

## 2015-07-02 DIAGNOSIS — R0982 Postnasal drip: Secondary | ICD-10-CM

## 2015-07-02 NOTE — Patient Instructions (Signed)
Please take over the counter nexium or prilosec once daily for 2 weeks  Please use flonase 2 sprays each nostril daily for 3 weeks  Please follow up if any worsening, new concerns or if symptoms persist

## 2015-07-02 NOTE — Progress Notes (Addendum)
HPI:  Acute visit for:  Sore throat: -for 1 week -symptoms: scratchy throat, sore throat, globus sensation at times. PND, burping sometimes -denies: cough, fevers, abd pain, heartburn, SOB, dysphagia, dysphonia, weight loss, malaise, vomiting, nausea, sinus pain  ROS: See pertinent positives and negatives per HPI.  Past Medical History:  Diagnosis Date  . Acne   . Arthritis   . DEPRESSION   . Fibroid uterus 07/31/2013   Right anterior fibroid 31 x 21 x 33 mm subserous, intramural 16 x 17 mm.   . Lichen planus    of the scalp - sees dermatologist in [place]   . Raynaud's disease     Past Surgical History:  Procedure Laterality Date  . COLONOSCOPY  2006   normal  . PELVIC LAPAROSCOPY    . TUBAL LIGATION  01/1999    Family History  Problem Relation Age of Onset  . Hypertension Mother   . Hyperlipidemia Mother   . Cancer Father     lung cancer  . Breast cancer Cousin     either 81 or 5  . Asthma Sister   . Hypertension Sister   . Colon cancer Neg Hx     Social History   Social History  . Marital status: Married    Spouse name: N/A  . Number of children: N/A  . Years of education: N/A   Social History Main Topics  . Smoking status: Never Smoker  . Smokeless tobacco: Never Used     Comment: Married, lives with spouse and 2 kids  . Alcohol use No  . Drug use: No  . Sexual activity: Yes    Partners: Male    Birth control/ protection: Surgical     Comment: intercourse age 49, sexual partners less than 5   Other Topics Concern  . None   Social History Narrative   Works as a Theme park manager.   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    Education: 2 years of college.    Lifestyle: no regular exercise; diet is healthy   Religion: husband is a Theme park manager at [employer]     Current Outpatient Prescriptions:  .  gabapentin (NEURONTIN) 300 MG capsule, Take 300 mg by mouth at bedtime., Disp: , Rfl:  .   fluconazole (DIFLUCAN) 100 MG tablet, Take daily for 5 days and then prn, Disp: 15 tablet, Rfl: 0  EXAM:  Vitals:   07/02/15 1354  BP: (!) 100/58  Pulse: 79  Temp: 97.9 F (36.6 C)    Body mass index is 24.94 kg/m.  GENERAL: vitals reviewed and listed above, alert, oriented, appears well hydrated and in no acute distress  HEENT: atraumatic, conjunttiva clear, no obvious abnormalities on inspection of external nose and ears, normal appearance of ear canals and TMs, clear nasal congestion, mild post oropharyngeal erythema with PND, no tonsillar edema or exudate, no sinus TTP  NECK: no obvious masses on inspection  ABD: BS+, soft, NTTP  LUNGS: clear to auscultation bilaterally, no wheezes, rales or rhonchi, good air movement  CV: HRRR, no peripheral edema  MS: moves all extremities without noticeable abnormality  PSYCH: pleasant and cooperative, no obvious depression or anxiety  ASSESSMENT AND PLAN:  Discussed the following assessment and plan:  Post-nasal drip  -we discussed possible serious and likely etiologies, workup and treatment, treatment risks and return precautions - suspect rhinitis or mild reflux -after this discussion, Tina Stokes opted for short course low dose acid reducer,  INS, close follow up if symptoms persist -follow up advised in 2-4 weeks unless complete resolution of symptoms and no concerns -of course, we advised Tina Stokes  to return or notify a doctor immediately if symptoms worsen or persist or new concerns arise.  .  -Patient advised to return or notify a doctor immediately if symptoms worsen or persist or new concerns arise.  Patient Instructions  Please take over the counter nexium or prilosec once daily for 2 weeks  Please use flonase 2 sprays each nostril daily for 3 weeks  Please follow up if any worsening, new concerns or if symptoms persist    Cecilie Heidel R.

## 2015-07-02 NOTE — Progress Notes (Signed)
Pre visit review using our clinic review tool, if applicable. No additional management support is needed unless otherwise documented below in the visit note. 

## 2015-08-04 IMAGING — CR DG KNEE COMPLETE 4+V*R*
4 series · 4 of 4 positions shown · non-contrast
Comparison: None available.

CLINICAL DATA: Fall

EXAM:
RIGHT KNEE - COMPLETE 4+ VIEW

[t knee ap right]
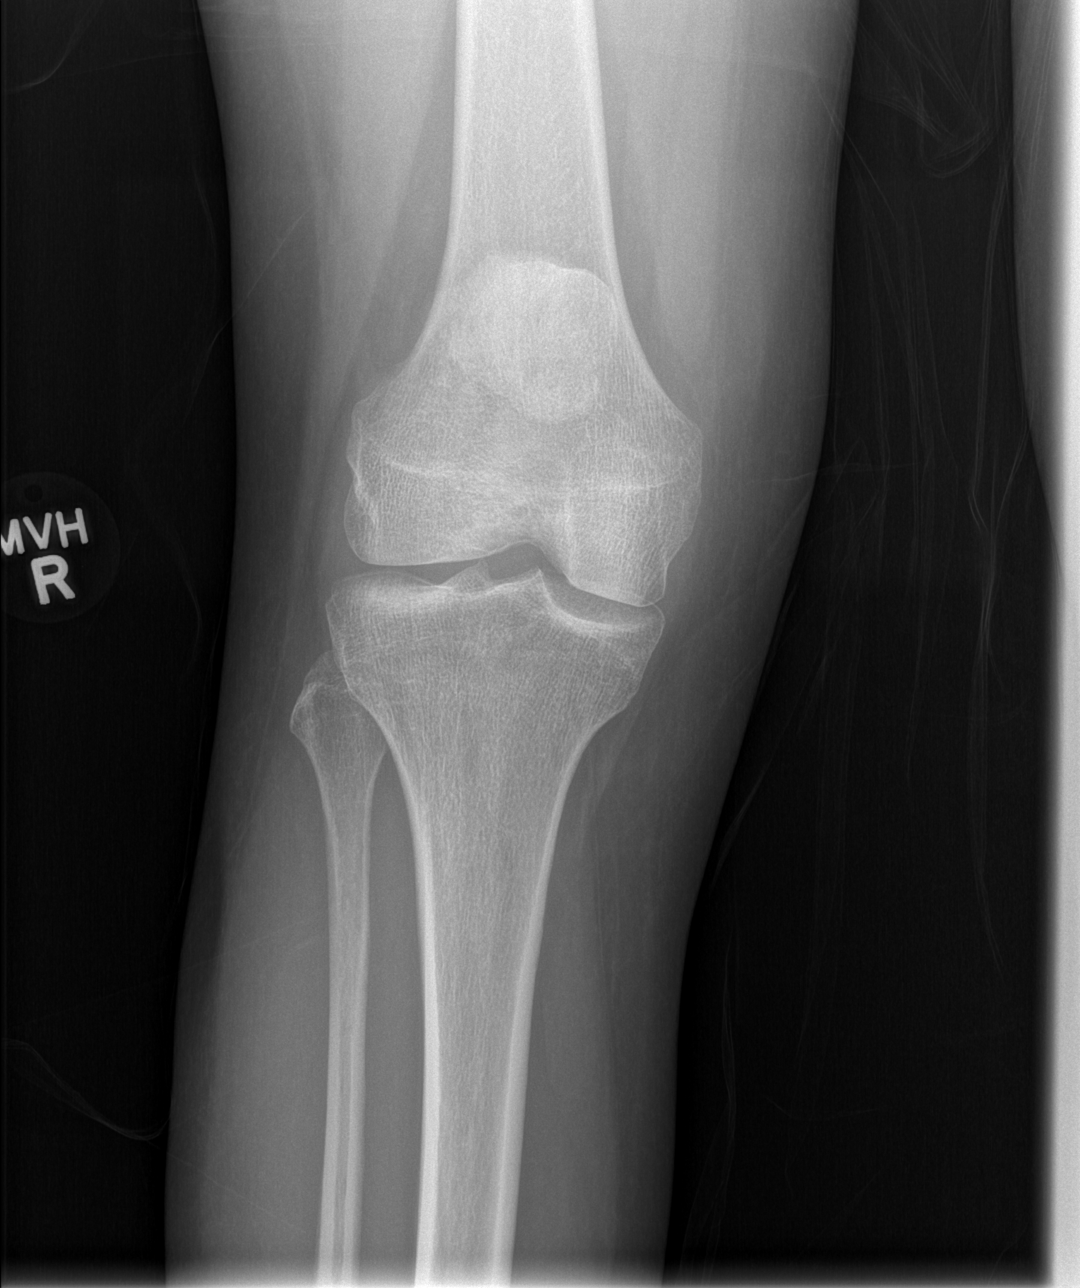

[t knee obl right (1 of 2)]
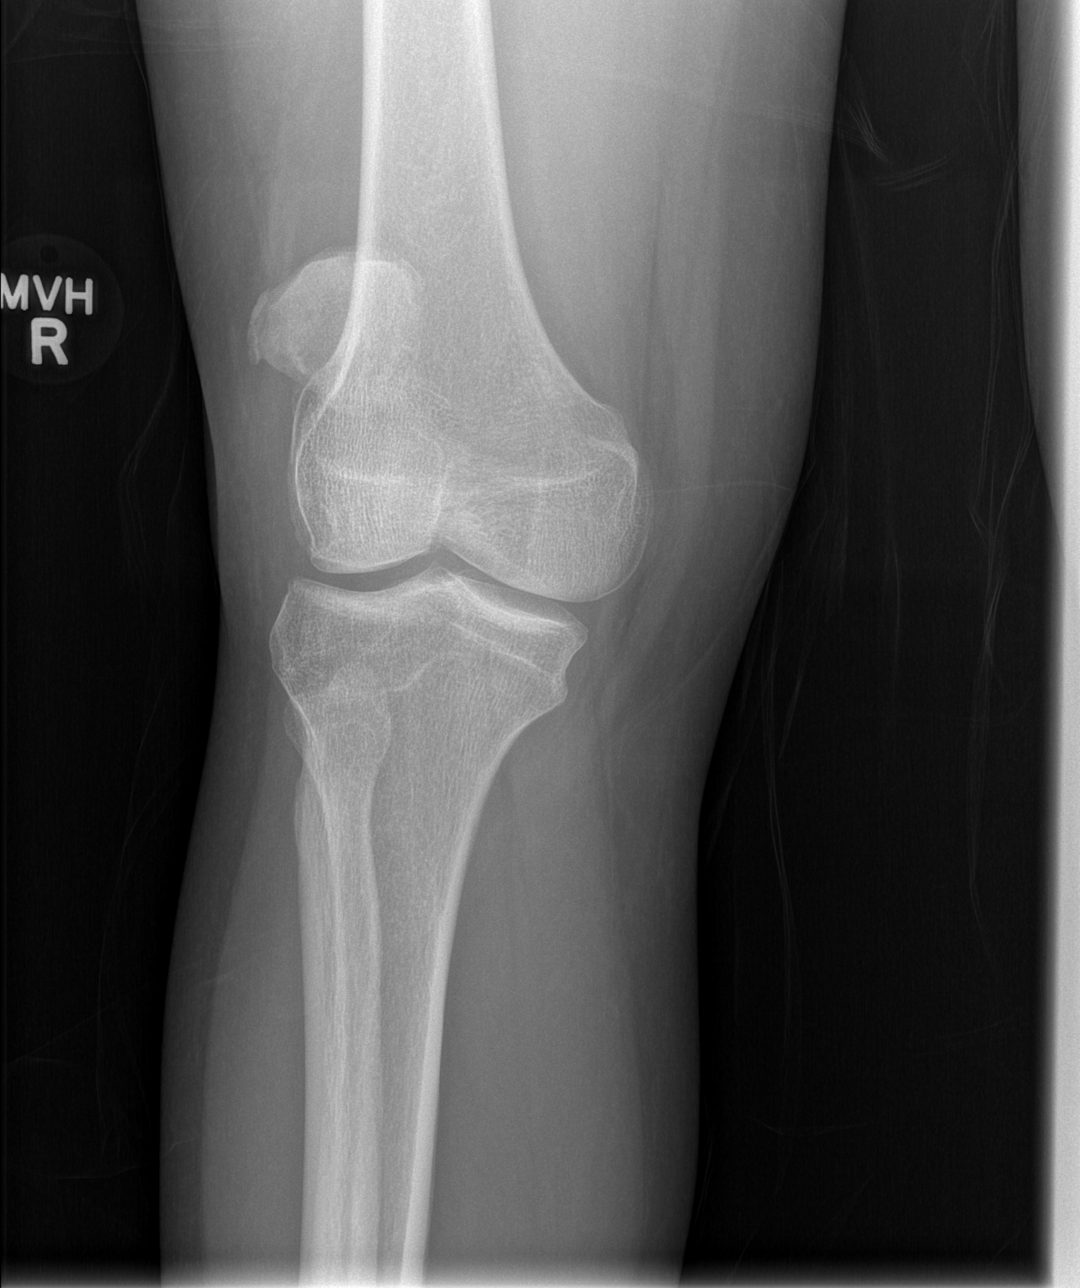

[t knee obl right (2 of 2)]
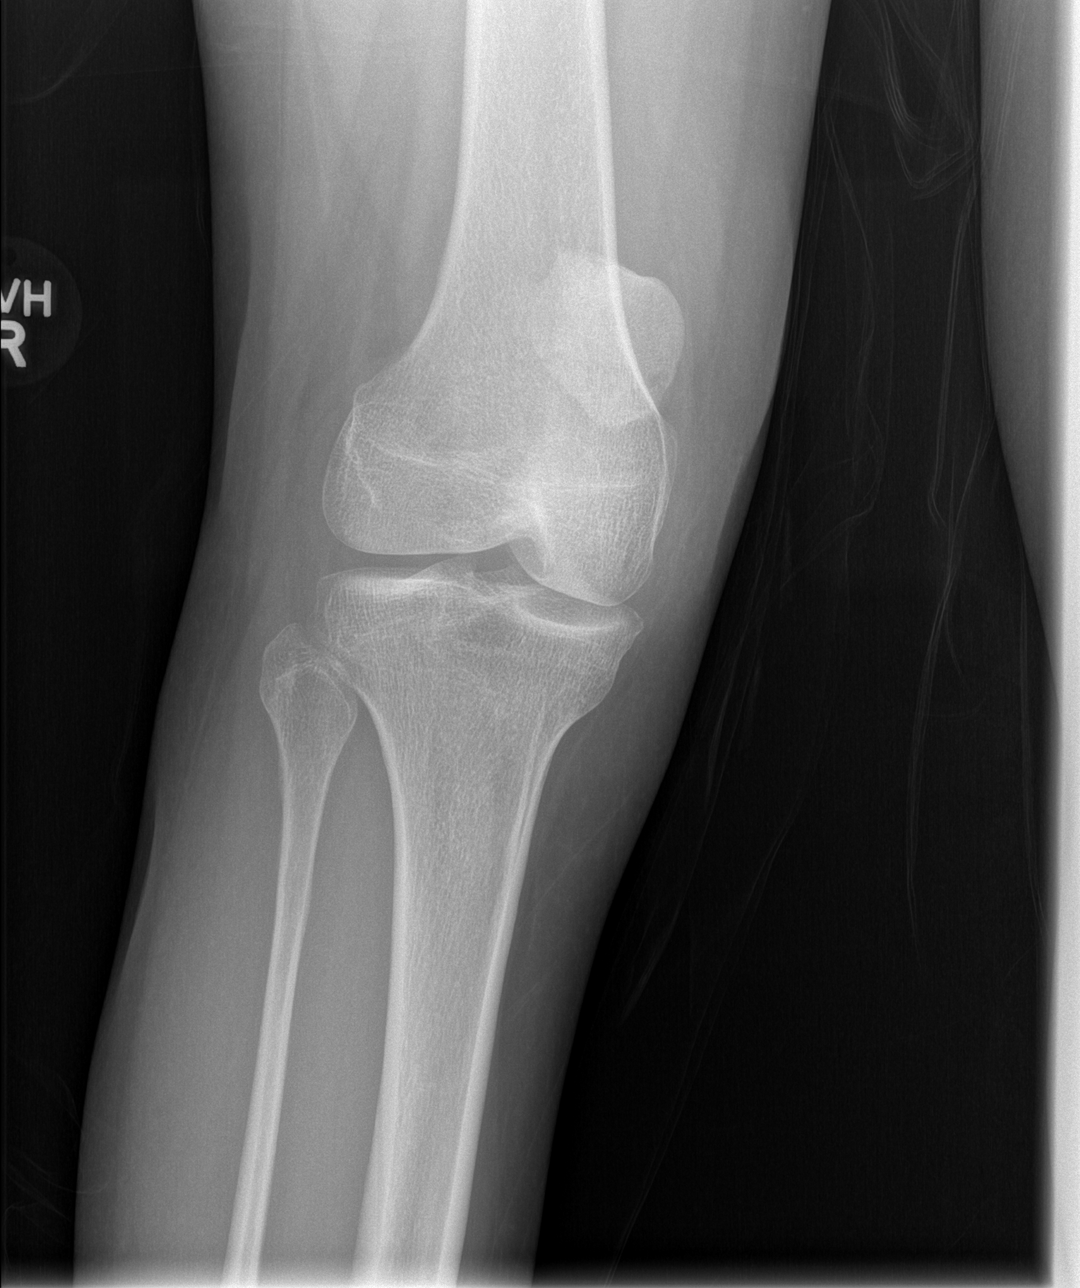

[t knee lat right]
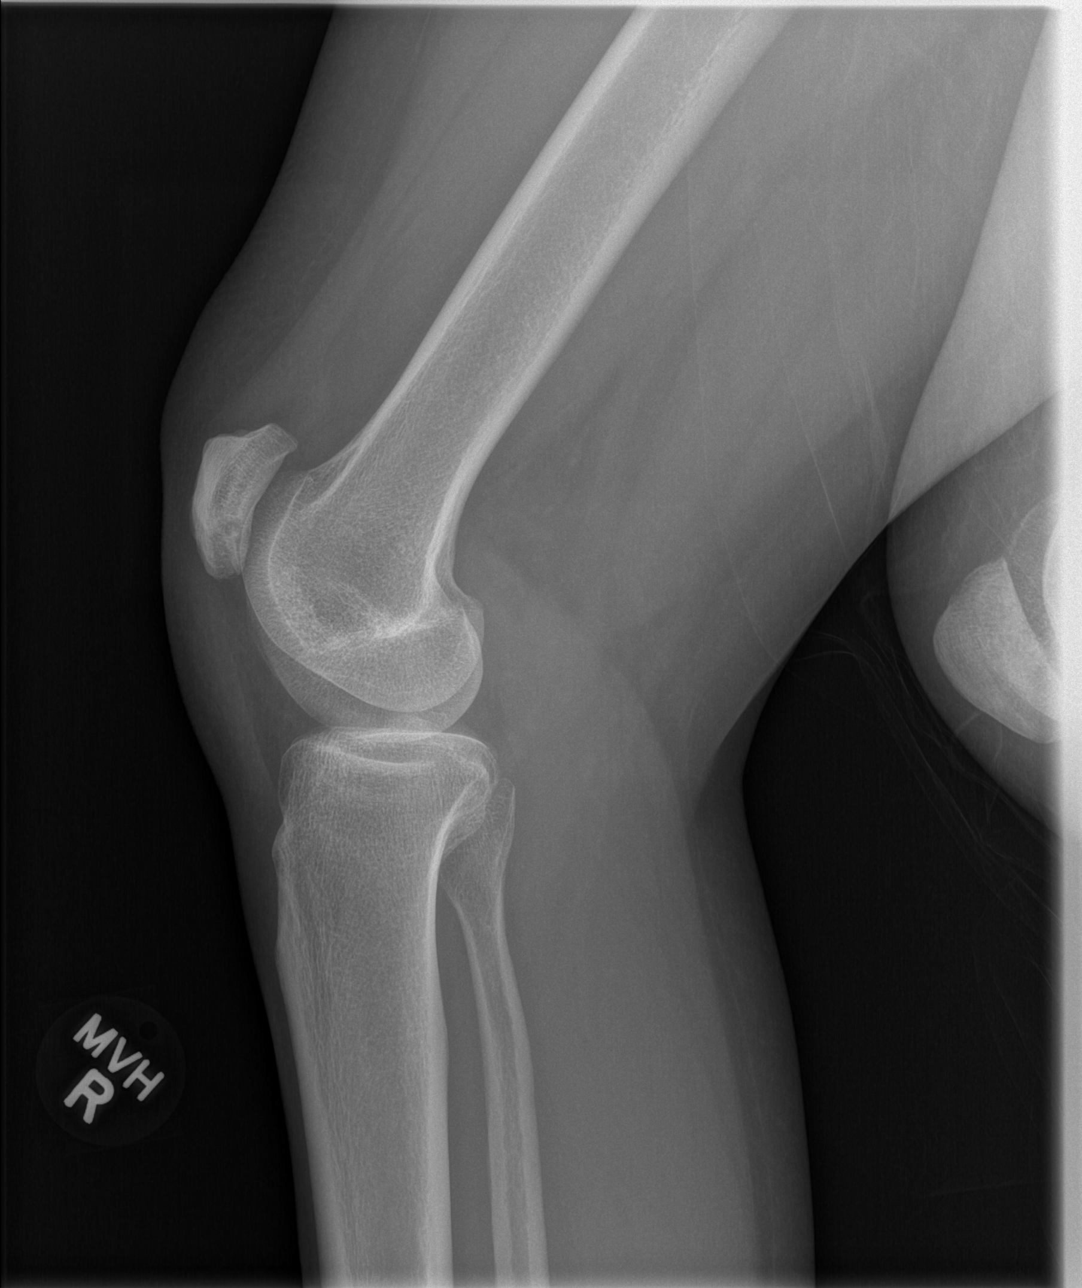

[4 of 4 positions shown; findings below may reference images not displayed]

FINDINGS: There is an age-indeterminate linear lucency traversing the inferior
pole of the patella, which may represent an acute nondisplaced
fracture. No significant overlying soft tissue swelling. There is
trace joint effusion within the suprapatellar recess.

Distal femur and proximal tibia are intact. Proximal fibula is
intact. Joint space remains normal alignment.
IMPRESSION: Age-indeterminate lucency traversing the inferior pole of the
patella. Finding may be subacute to chronic in nature given the
relative lack of overlying soft tissue swelling and relatively
small/ trace joint effusion. Possible acute nondisplaced fracture
not entirely excluded however, correlation with physical exam
recommended.

## 2015-09-14 ENCOUNTER — Encounter: Payer: Self-pay | Admitting: Gynecology

## 2015-09-14 ENCOUNTER — Ambulatory Visit (INDEPENDENT_AMBULATORY_CARE_PROVIDER_SITE_OTHER): Payer: BLUE CROSS/BLUE SHIELD | Admitting: Gynecology

## 2015-09-14 VITALS — BP 120/76

## 2015-09-14 DIAGNOSIS — R350 Frequency of micturition: Secondary | ICD-10-CM

## 2015-09-14 DIAGNOSIS — D251 Intramural leiomyoma of uterus: Secondary | ICD-10-CM

## 2015-09-14 DIAGNOSIS — R102 Pelvic and perineal pain: Secondary | ICD-10-CM | POA: Diagnosis not present

## 2015-09-14 DIAGNOSIS — N898 Other specified noninflammatory disorders of vagina: Secondary | ICD-10-CM

## 2015-09-14 LAB — URINALYSIS W MICROSCOPIC + REFLEX CULTURE
BILIRUBIN URINE: NEGATIVE
Casts: NONE SEEN [LPF]
Crystals: NONE SEEN [HPF]
GLUCOSE, UA: NEGATIVE
KETONES UR: NEGATIVE
NITRITE: NEGATIVE
PH: 6.5 (ref 5.0–8.0)
Protein, ur: NEGATIVE
SPECIFIC GRAVITY, URINE: 1.025 (ref 1.001–1.035)

## 2015-09-14 LAB — WET PREP FOR TRICH, YEAST, CLUE
CLUE CELLS WET PREP: NONE SEEN
TRICH WET PREP: NONE SEEN

## 2015-09-14 MED ORDER — HYDROCORTISONE 1 % EX CREA: 1.0000 "application " | TOPICAL_CREAM | Freq: Two times a day (BID) | CUTANEOUS | Status: DC

## 2015-09-14 MED ORDER — FLUCONAZOLE 150 MG PO TABS: ORAL_TABLET | ORAL | Status: DC

## 2015-09-14 NOTE — Progress Notes (Signed)
   Patient is a 50 year old who stated that yesterday she been experiencing some vulvar pruritus and a slight white discharge some mild urinary frequency but no dysuria. Patient states she has some vague low back discomfort. Patient denied any nausea, vomiting, fever, or chills. Patient is in a monogamous relationship.  Exam: Bartholin urethra Skene was within normal limits Vagina: Slight white discharge noted cervix: No gross lesions on inspection Uterus: Anteverted irregular fundal fibroid towards the right side noted questionable right ovarian cyst small nontender Left adnexa no palpable mass or tenderness Rectal exam: Not done  Urinalysis 6-10 WBC RBC 0-2 Bacteria few Yeast were seen in the urine sample  Wet prep many yeast hyphae present few white blood cells and moderate bacteria  Review of patient's record indication had an ultrasound in 2015 which indicated subserosal fundal fibroid which measured 31 x 22 x 33 mm and normal ovaries.  Assessment/plan: #1 yeast vaginitis will be treated with Diflucan 150 mg tablet 1 today and to repeat in 48 hours. Patient requesting additional prescription for Monistat cream to use as well which will be provided. #2 we'll wait for the results of the urine culture could be just contaminant #3 history of fibroid uterus and with the vague pelvic discomfort we'll schedule an ultrasound within the next week.

## 2015-09-14 NOTE — Patient Instructions (Signed)
Fluconazole tablets What is this medicine? FLUCONAZOLE (floo KON na zole) is an antifungal medicine. It is used to treat certain kinds of fungal or yeast infections. This medicine may be used for other purposes; ask your health care provider or pharmacist if you have questions. What should I tell my health care provider before I take this medicine? They need to know if you have any of these conditions: -electrolyte abnormalities -history of irregular heart beat -kidney disease -an unusual or allergic reaction to fluconazole, other azole antifungals, medicines, foods, dyes, or preservatives -pregnant or trying to get pregnant -breast-feeding How should I use this medicine? Take this medicine by mouth. Follow the directions on the prescription label. Do not take your medicine more often than directed. Talk to your pediatrician regarding the use of this medicine in children. Special care may be needed. This medicine has been used in children as young as 58 months of age. Overdosage: If you think you have taken too much of this medicine contact a poison control center or emergency room at once. NOTE: This medicine is only for you. Do not share this medicine with others. What if I miss a dose? If you miss a dose, take it as soon as you can. If it is almost time for your next dose, take only that dose. Do not take double or extra doses. What may interact with this medicine? Do not take this medicine with any of the following medications: -astemizole -certain medicines for irregular heart beat like dofetilide, dronedarone, quinidine -cisapride -erythromycin -lomitapide -other medicines that prolong the QT interval (cause an abnormal heart rhythm) -pimozide -terfenadine -thioridazine -tolvaptan -ziprasidone This medicine may also interact with the following medications: -antiviral medicines for HIV or AIDS -birth control pills -certain antibiotics like rifabutin, rifampin -certain  medicines for blood pressure like amlodipine, isradipine, felodipine, hydrochlorothiazide, losartan, nifedipine -certain medicines for cancer like cyclophosphamide, vinblastine, vincristine -certain medicines for cholesterol like atorvastatin, lovastatin, fluvastatin, simvastatin -certain medicines for depression, anxiety, or psychotic disturbances like amitriptyline, midazolam, nortriptyline, triazolam -certain medicines for diabetes like glipizide, glyburide, tolbutamide -certain medicines for pain like alfentanil, fentanyl, methadone -certain medicines for seizures like carbamazepine, phenytoin -certain medicines that treat or prevent blood clots like warfarin -halofantrine -medicines that lower your chance of fighting infection like cyclosporine, prednisone, tacrolimus -NSAIDS, medicines for pain and inflammation, like celecoxib, diclofenac, flurbiprofen, ibuprofen, meloxicam, naproxen -other medicines for fungal infections -sirolimus -theophylline -tofacitinib This list may not describe all possible interactions. Give your health care provider a list of all the medicines, herbs, non-prescription drugs, or dietary supplements you use. Also tell them if you smoke, drink alcohol, or use illegal drugs. Some items may interact with your medicine. What should I watch for while using this medicine? Visit your doctor or health care professional for regular checkups. If you are taking this medicine for a long time you may need blood work. Tell your doctor if your symptoms do not improve. Some fungal infections need many weeks or months of treatment to cure. Alcohol can increase possible damage to your liver. Avoid alcoholic drinks. If you have a vaginal infection, do not have sex until you have finished your treatment. You can wear a sanitary napkin. Do not use tampons. Wear freshly washed cotton, not synthetic, panties. What side effects may I notice from receiving this medicine? Side effects that  you should report to your doctor or health care professional as soon as possible: -allergic reactions like skin rash or itching, hives, swelling of the lips, mouth,  tongue, or throat -dark urine -feeling dizzy or faint -irregular heartbeat or chest pain -redness, blistering, peeling or loosening of the skin, including inside the mouth -trouble breathing -unusual bruising or bleeding -vomiting -yellowing of the eyes or skin Side effects that usually do not require medical attention (report to your doctor or health care professional if they continue or are bothersome): -changes in how food tastes -diarrhea -headache -stomach upset or nausea This list may not describe all possible side effects. Call your doctor for medical advice about side effects. You may report side effects to FDA at 1-800-FDA-1088. Where should I keep my medicine? Keep out of the reach of children. Store at room temperature below 30 degrees C (86 degrees F). Throw away any medicine after the expiration date. NOTE: This sheet is a summary. It may not cover all possible information. If you have questions about this medicine, talk to your doctor, pharmacist, or health care provider.    2016, Elsevier/Gold Standard. (2012-12-22 16:13:04) Monilial Vaginitis Vaginitis in a soreness, swelling and redness (inflammation) of the vagina and vulva. Monilial vaginitis is not a sexually transmitted infection. CAUSES  Yeast vaginitis is caused by yeast (candida) that is normally found in your vagina. With a yeast infection, the candida has overgrown in number to a point that upsets the chemical balance. SYMPTOMS   White, thick vaginal discharge.  Swelling, itching, redness and irritation of the vagina and possibly the lips of the vagina (vulva).  Burning or painful urination.  Painful intercourse. DIAGNOSIS  Things that may contribute to monilial vaginitis are:  Postmenopausal and virginal  states.  Pregnancy.  Infections.  Being tired, sick or stressed, especially if you had monilial vaginitis in the past.  Diabetes. Good control will help lower the chance.  Birth control pills.  Tight fitting garments.  Using bubble bath, feminine sprays, douches or deodorant tampons.  Taking certain medications that kill germs (antibiotics).  Sporadic recurrence can occur if you become ill. TREATMENT  Your caregiver will give you medication.  There are several kinds of anti monilial vaginal creams and suppositories specific for monilial vaginitis. For recurrent yeast infections, use a suppository or cream in the vagina 2 times a week, or as directed.  Anti-monilial or steroid cream for the itching or irritation of the vulva may also be used. Get your caregiver's permission.  Painting the vagina with methylene blue solution may help if the monilial cream does not work.  Eating yogurt may help prevent monilial vaginitis. HOME CARE INSTRUCTIONS   Finish all medication as prescribed.  Do not have sex until treatment is completed or after your caregiver tells you it is okay.  Take warm sitz baths.  Do not douche.  Do not use tampons, especially scented ones.  Wear cotton underwear.  Avoid tight pants and panty hose.  Tell your sexual partner that you have a yeast infection. They should go to their caregiver if they have symptoms such as mild rash or itching.  Your sexual partner should be treated as well if your infection is difficult to eliminate.  Practice safer sex. Use condoms.  Some vaginal medications cause latex condoms to fail. Vaginal medications that harm condoms are:  Cleocin cream.  Butoconazole (Femstat).  Terconazole (Terazol) vaginal suppository.  Miconazole (Monistat) (may be purchased over the counter). SEEK MEDICAL CARE IF:   You have a temperature by mouth above 102 F (38.9 C).  The infection is getting worse after 2 days of  treatment.  The infection is not  getting better after 3 days of treatment.  You develop blisters in or around your vagina.  You develop vaginal bleeding, and it is not your menstrual period.  You have pain when you urinate.  You develop intestinal problems.  You have pain with sexual intercourse.   This information is not intended to replace advice given to you by your health care provider. Make sure you discuss any questions you have with your health care provider.   Document Released: 02/23/2005 Document Revised: 08/08/2011 Document Reviewed: 11/17/2014 Elsevier Interactive Patient Education [employer].

## 2015-09-15 ENCOUNTER — Telehealth: Payer: Self-pay | Admitting: Gynecology

## 2015-09-15 ENCOUNTER — Telehealth: Payer: Self-pay | Admitting: *Deleted

## 2015-09-15 MED ORDER — TERCONAZOLE 0.4 % VA CREA: 1.0000 | TOPICAL_CREAM | Freq: Every day | VAGINAL | Status: DC

## 2015-09-15 NOTE — Telephone Encounter (Signed)
On Call Note: 09/14/2015 8:40 PM  Received prescriptions for yeast pill and cream.  Received hydrocortisone cream but thought she was getting yeast cream.  Recommended take the diflucan and use the hydrocortisone cream externally.  Call if irritation continues.

## 2015-09-15 NOTE — Telephone Encounter (Signed)
Call in prescription for Terazol 7 to apply twice a day for 7 days when necessary

## 2015-09-15 NOTE — Telephone Encounter (Signed)
Pt aware Rx sent.  

## 2015-09-15 NOTE — Telephone Encounter (Signed)
Pt called requesting Rx for Terazol 3 day cream , was seen yesterday, you prescribed diflucan 150 # 2 tablet one today and repeat in 2 days along with Monistat cream. Pt took one tablet yesterday, has used the cream externally, but has lots of internal itching, asked if you could please give Rx for Terazol cream? Please advise

## 2015-09-16 LAB — URINE CULTURE: Colony Count: 25000

## 2015-09-24 ENCOUNTER — Other Ambulatory Visit: Payer: Self-pay | Admitting: Orthopedic Surgery

## 2015-10-05 ENCOUNTER — Encounter: Payer: Self-pay | Admitting: Gynecology

## 2015-10-05 ENCOUNTER — Other Ambulatory Visit: Payer: Self-pay | Admitting: Gynecology

## 2015-10-05 ENCOUNTER — Ambulatory Visit (INDEPENDENT_AMBULATORY_CARE_PROVIDER_SITE_OTHER): Payer: BLUE CROSS/BLUE SHIELD

## 2015-10-05 ENCOUNTER — Ambulatory Visit (INDEPENDENT_AMBULATORY_CARE_PROVIDER_SITE_OTHER): Payer: BLUE CROSS/BLUE SHIELD | Admitting: Gynecology

## 2015-10-05 VITALS — BP 118/72

## 2015-10-05 DIAGNOSIS — D251 Intramural leiomyoma of uterus: Secondary | ICD-10-CM | POA: Diagnosis not present

## 2015-10-05 DIAGNOSIS — R102 Pelvic and perineal pain: Secondary | ICD-10-CM

## 2015-10-05 DIAGNOSIS — N83202 Unspecified ovarian cyst, left side: Secondary | ICD-10-CM | POA: Diagnosis not present

## 2015-10-05 DIAGNOSIS — D252 Subserosal leiomyoma of uterus: Secondary | ICD-10-CM | POA: Diagnosis not present

## 2015-10-05 DIAGNOSIS — N8312 Corpus luteum cyst of left ovary: Secondary | ICD-10-CM

## 2015-10-05 NOTE — Progress Notes (Signed)
   Patient presented to the office for an ultrasound due to the fact that time of her last visit on April 17 when she was having a vaginal discharge her uterus was found to be regular especially near the fundal region. Patient with past history of fibroid uterus. She was treated for yeast infection with Diflucan. She was a symptom of today.  Ultrasound today: Uterus measured 9.3 x 6.8 x 7.3 cm endometrial stripe 7.2 mm. 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 And also a small intramural myoma measuring 12 x 19 mm was noted. Right ovary was normal. Left ovary thick wall corpus luteum cyst with internal low level echoes positive color flow the periphery measuring 19 x 20 x 20 mm. Negative color flow.  Patient been stated 2 weeks ago  Assessment/plan: Stable intramural fibroid from 2015. Patient scheduled to return back at the end of the year for her annual exam or when necessary.  Greater than 50% of the time was spent discussing and comparing ultrasound on this patient fibroid uterus time of consultation 10 minutes

## 2015-10-05 NOTE — Patient Instructions (Signed)
Uterine Fibroids Uterine fibroids are tissue masses (tumors) that can develop in the womb (uterus). They are also called leiomyomas. This type of tumor is not cancerous (benign) and does not spread to other parts of the body outside of the pelvic area, which is between the hip bones. Occasionally, fibroids may develop in the fallopian tubes, in the cervix, or on the support structures (ligaments) that surround the uterus. You can have one or many fibroids. Fibroids can vary in size, weight, and where they grow in the uterus. Some can become quite large. Most fibroids do not require medical treatment. CAUSES A fibroid can develop when a single uterine cell keeps growing (replicating). Most cells in the human body have a control mechanism that keeps them from replicating without control. SIGNS AND SYMPTOMS Symptoms may include:   Heavy bleeding during your period.  Bleeding or spotting between periods.  Pelvic pain and pressure.  Bladder problems, such as needing to urinate more often (urinary frequency) or urgently.  Inability to reproduce offspring (infertility).  Miscarriages. DIAGNOSIS Uterine fibroids are diagnosed through a physical exam. Your health care provider may feel the lumpy tumors during a pelvic exam. Ultrasonography and an MRI may be done to determine the size, location, and number of fibroids. TREATMENT Treatment may include:  Watchful waiting. This involves getting the fibroid checked by your health care provider to see if it grows or shrinks. Follow your health care provider's recommendations for how often to have this checked.  Hormone medicines. These can be taken by mouth or given through an intrauterine device (IUD).  Surgery.  Removing the fibroids (myomectomy) or the uterus (hysterectomy).  Removing blood supply to the fibroids (uterine artery embolization). If fibroids interfere with your fertility and you want to become pregnant, your health care provider  may recommend having the fibroids removed.  HOME CARE INSTRUCTIONS  Keep all follow-up visits as directed by your health care provider. This is important.  Take medicines only as directed by your health care provider.  If you were prescribed a hormone treatment, take the hormone medicines exactly as directed.  Do not take aspirin, because it can cause bleeding.  Ask your health care provider about taking iron pills and increasing the amount of dark green, leafy vegetables in your diet. These actions can help to boost your blood iron levels, which may be affected by heavy menstrual bleeding.  Pay close attention to your period and tell your health care provider about any changes, such as:  Increased blood flow that requires you to use more pads or tampons than usual per month.  A change in the number of days that your period lasts per month.  A change in symptoms that are associated with your period, such as abdominal cramping or back pain. SEEK MEDICAL CARE IF:  You have pelvic pain, back pain, or abdominal cramps that cannot be controlled with medicines.  You have an increase in bleeding between and during periods.  You soak tampons or pads in a half hour or less.  You feel lightheaded, extra tired, or weak. SEEK IMMEDIATE MEDICAL CARE IF:  You faint.  You have a sudden increase in pelvic pain.   This information is not intended to replace advice given to you by your health care provider. Make sure you discuss any questions you have with your health care provider.   Document Released: 05/13/2000 Document Revised: 06/06/2014 Document Reviewed: 11/12/2013 Elsevier Interactive Patient Education 2016 Elsevier [employer].  

## 2015-10-23 ENCOUNTER — Encounter (HOSPITAL_BASED_OUTPATIENT_CLINIC_OR_DEPARTMENT_OTHER): Payer: Self-pay

## 2015-10-23 ENCOUNTER — Ambulatory Visit (HOSPITAL_BASED_OUTPATIENT_CLINIC_OR_DEPARTMENT_OTHER): Admit: 2015-10-23 | Payer: BLUE CROSS/BLUE SHIELD | Admitting: Orthopedic Surgery

## 2015-10-23 SURGERY — EXCISION MASS
Anesthesia: General | Site: Finger | Laterality: Right

## 2015-12-07 ENCOUNTER — Encounter: Payer: Self-pay | Admitting: Women's Health

## 2015-12-23 ENCOUNTER — Other Ambulatory Visit: Payer: Self-pay | Admitting: Gynecology

## 2015-12-24 ENCOUNTER — Ambulatory Visit: Payer: BLUE CROSS/BLUE SHIELD | Admitting: Women's Health

## 2016-02-03 ENCOUNTER — Ambulatory Visit (INDEPENDENT_AMBULATORY_CARE_PROVIDER_SITE_OTHER): Payer: BLUE CROSS/BLUE SHIELD | Admitting: Women's Health

## 2016-02-03 ENCOUNTER — Encounter: Payer: Self-pay | Admitting: Women's Health

## 2016-02-03 VITALS — BP 122/80 | Ht 61.0 in | Wt 142.0 lb

## 2016-02-03 DIAGNOSIS — B3731 Acute candidiasis of vulva and vagina: Secondary | ICD-10-CM

## 2016-02-03 DIAGNOSIS — Z01419 Encounter for gynecological examination (general) (routine) without abnormal findings: Secondary | ICD-10-CM

## 2016-02-03 DIAGNOSIS — B373 Candidiasis of vulva and vagina: Secondary | ICD-10-CM

## 2016-02-03 MED ORDER — FLUCONAZOLE 100 MG PO TABS: 100.0000 mg | ORAL_TABLET | Freq: Every day | ORAL | 0 refills | Status: DC

## 2016-02-03 NOTE — Addendum Note (Signed)
Addended by: Thamas Jaegers on: 02/03/2016 09:32 AM   Modules accepted: Orders

## 2016-02-03 NOTE — Patient Instructions (Signed)
dive Diverticulosis Diverticulosis is the condition that develops when small pouches (diverticula) form in the wall of your colon. Your colon, or large intestine, is where water is absorbed and stool is formed. The pouches form when the inside layer of your colon pushes through weak spots in the outer layers of your colon. CAUSES  No one knows exactly what causes diverticulosis. RISK FACTORS  Being older than 68. Your risk for this condition increases with age. Diverticulosis is rare in people younger than 40 years. By age 5, almost everyone has it.  Eating a low-fiber diet.  Being frequently constipated.  Being overweight.  Not getting enough exercise.  Smoking.  Taking over-the-counter pain medicines, like aspirin and ibuprofen. SYMPTOMS  Most people with diverticulosis do not have symptoms. DIAGNOSIS  Because diverticulosis often has no symptoms, health care providers often discover the condition during an exam for other colon problems. In many cases, a health care provider will diagnose diverticulosis while using a flexible scope to examine the colon (colonoscopy). TREATMENT  If you have never developed an infection related to diverticulosis, you may not need treatment. If you have had an infection before, treatment may include:  Eating more fruits, vegetables, and grains.  Taking a fiber supplement.  Taking a live bacteria supplement (probiotic).  Taking medicine to relax your colon. HOME CARE INSTRUCTIONS   Drink at least 6-8 glasses of water each day to prevent constipation.  Try not to strain when you have a bowel movement.  Keep all follow-up appointments. If you have had an infection before:  Increase the fiber in your diet as directed by your health care provider or dietitian.  Take a dietary fiber supplement if your health care provider approves.  Only take medicines as directed by your health care provider. SEEK MEDICAL CARE IF:   You have abdominal  pain.  You have bloating.  You have cramps.  You have not gone to the bathroom in 3 days. SEEK IMMEDIATE MEDICAL CARE IF:   Your pain gets worse.  Yourbloating becomes very bad.  You have a fever or chills, and your symptoms suddenly get worse.  You begin vomiting.  You have bowel movements that are bloody or black. MAKE SURE YOU:  Understand these instructions.  Will watch your condition.  Will get help right away if you are not doing well or get worse.   This information is not intended to replace advice given to you by your health care provider. Make sure you discuss any questions you have with your health care provider.   Document Released: 02/11/2004 Document Revised: 05/21/2013 Document Reviewed: 04/10/2013 Elsevier Interactive Patient Education [employer]. Menopause is a normal process in which your reproductive ability comes to an end. This process happens gradually over a span of months to years, usually between the ages of 71 and 29. Menopause is complete when you have missed 12 consecutive menstrual periods. It is important to talk with your health care provider about some of the most common conditions that affect postmenopausal women, such as heart disease, cancer, and bone loss (osteoporosis). Adopting a healthy lifestyle and getting preventive care can help to promote your health and wellness. Those actions can also lower your chances of developing some of these common conditions. WHAT SHOULD I KNOW ABOUT MENOPAUSE? During menopause, you may experience a number of symptoms, such as:  Moderate-to-severe hot flashes.  Night sweats.  Decrease in sex drive.  Mood swings.  Headaches.  Tiredness.  Irritability.  Memory problems.  Insomnia. Choosing to treat or not to treat menopausal changes is an individual decision that you make with your health care provider. WHAT SHOULD I KNOW ABOUT HORMONE REPLACEMENT THERAPY AND SUPPLEMENTS? Hormone therapy  products are effective for treating symptoms that are associated with menopause, such as hot flashes and night sweats. Hormone replacement carries certain risks, especially as you become older. If you are thinking about using estrogen or estrogen with progestin treatments, discuss the benefits and risks with your health care provider. WHAT SHOULD I KNOW ABOUT HEART DISEASE AND STROKE? Heart disease, heart attack, and stroke become more likely as you age. This may be due, in part, to the hormonal changes that your body experiences during menopause. These can affect how your body processes dietary fats, triglycerides, and cholesterol. Heart attack and stroke are both medical emergencies. There are many things that you can do to help prevent heart disease and stroke:  Have your blood pressure checked at least every 1-2 years. High blood pressure causes heart disease and increases the risk of stroke.  If you are 70-20 years old, ask your health care provider if you should take aspirin to prevent a heart attack or a stroke.  Do not use any tobacco products, including cigarettes, chewing tobacco, or electronic cigarettes. If you need help quitting, ask your health care provider.  It is important to eat a healthy diet and maintain a healthy weight.  Be sure to include plenty of vegetables, fruits, low-fat dairy products, and lean protein.  Avoid eating foods that are high in solid fats, added sugars, or salt (sodium).  Get regular exercise. This is one of the most important things that you can do for your health.  Try to exercise for at least 150 minutes each week. The type of exercise that you do should increase your heart rate and make you sweat. This is known as moderate-intensity exercise.  Try to do strengthening exercises at least twice each week. Do these in addition to the moderate-intensity exercise.  Know your numbers.Ask your health care provider to check your cholesterol and your blood  glucose. Continue to have your blood tested as directed by your health care provider. WHAT SHOULD I KNOW ABOUT CANCER SCREENING? There are several types of cancer. Take the following steps to reduce your risk and to catch any cancer development as early as possible. Breast Cancer  Practice breast self-awareness.  This means understanding how your breasts normally appear and feel.  It also means doing regular breast self-exams. Let your health care provider know about any changes, no matter how small.  If you are 40 or older, have a clinician do a breast exam (clinical breast exam or CBE) every year. Depending on your age, family history, and medical history, it may be recommended that you also have a yearly breast X-ray (mammogram).  If you have a family history of breast cancer, talk with your health care provider about genetic screening.  If you are at high risk for breast cancer, talk with your health care provider about having an MRI and a mammogram every year.  Breast cancer (BRCA) gene test is recommended for women who have family members with BRCA-related cancers. Results of the assessment will determine the need for genetic counseling and BRCA1 and for BRCA2 testing. BRCA-related cancers include these types:  Breast. This occurs in males or females.  Ovarian.  Tubal. This may also be called fallopian tube cancer.  Cancer of the abdominal or pelvic lining (peritoneal cancer).  Prostate.  Pancreatic. Cervical, Uterine, and Ovarian Cancer Your health care provider may recommend that you be screened regularly for cancer of the pelvic organs. These include your ovaries, uterus, and vagina. This screening involves a pelvic exam, which includes checking for microscopic changes to the surface of your cervix (Pap test).  For women ages 21-65, health care providers may recommend a pelvic exam and a Pap test every three years. For women ages 68-65, they may recommend the Pap test and  pelvic exam, combined with testing for human papilloma virus (HPV), every five years. Some types of HPV increase your risk of cervical cancer. Testing for HPV may also be done on women of any age who have unclear Pap test results.  Other health care providers may not recommend any screening for nonpregnant women who are considered low risk for pelvic cancer and have no symptoms. Ask your health care provider if a screening pelvic exam is right for you.  If you have had past treatment for cervical cancer or a condition that could lead to cancer, you need Pap tests and screening for cancer for at least 20 years after your treatment. If Pap tests have been discontinued for you, your risk factors (such as having a new sexual partner) need to be reassessed to determine if you should start having screenings again. Some women have medical problems that increase the chance of getting cervical cancer. In these cases, your health care provider may recommend that you have screening and Pap tests more often.  If you have a family history of uterine cancer or ovarian cancer, talk with your health care provider about genetic screening.  If you have vaginal bleeding after reaching menopause, tell your health care provider.  There are currently no reliable tests available to screen for ovarian cancer. Lung Cancer Lung cancer screening is recommended for adults 11-66 years old who are at high risk for lung cancer because of a history of smoking. A yearly low-dose CT scan of the lungs is recommended if you:  Currently smoke.  Have a history of at least 30 pack-years of smoking and you currently smoke or have quit within the past 15 years. A pack-year is smoking an average of one pack of cigarettes per day for one year. Yearly screening should:  Continue until it has been 15 years since you quit.  Stop if you develop a health problem that would prevent you from having lung cancer treatment. Colorectal  Cancer  This type of cancer can be detected and can often be prevented.  Routine colorectal cancer screening usually begins at age 16 and continues through age 28.  If you have risk factors for colon cancer, your health care provider may recommend that you be screened at an earlier age.  If you have a family history of colorectal cancer, talk with your health care provider about genetic screening.  Your health care provider may also recommend using home test kits to check for hidden blood in your stool.  A small camera at the end of a tube can be used to examine your colon directly (sigmoidoscopy or colonoscopy). This is done to check for the earliest forms of colorectal cancer.  Direct examination of the colon should be repeated every 5-10 years until age 46. However, if early forms of precancerous polyps or small growths are found or if you have a family history or genetic risk for colorectal cancer, you may need to be screened more often. Skin Cancer  Check your skin  from head to toe regularly.  Monitor any moles. Be sure to tell your health care provider:  About any new moles or changes in moles, especially if there is a change in a mole's shape or color.  If you have a mole that is larger than the size of a pencil eraser.  If any of your family members has a history of skin cancer, especially at a young age, talk with your health care provider about genetic screening.  Always use sunscreen. Apply sunscreen liberally and repeatedly throughout the day.  Whenever you are outside, protect yourself by wearing long sleeves, pants, a wide-brimmed hat, and sunglasses. WHAT SHOULD I KNOW ABOUT OSTEOPOROSIS? Osteoporosis is a condition in which bone destruction happens more quickly than new bone creation. After menopause, you may be at an increased risk for osteoporosis. To help prevent osteoporosis or the bone fractures that can happen because of osteoporosis, the following is  recommended:  If you are 16-28 years old, get at least 1,000 mg of calcium and at least 600 mg of vitamin D per day.  If you are older than age 27 but younger than age 43, get at least 1,200 mg of calcium and at least 600 mg of vitamin D per day.  If you are older than age 34, get at least 1,200 mg of calcium and at least 800 mg of vitamin D per day. Smoking and excessive alcohol intake increase the risk of osteoporosis. Eat foods that are rich in calcium and vitamin D, and do weight-bearing exercises several times each week as directed by your health care provider. WHAT SHOULD I KNOW ABOUT HOW MENOPAUSE AFFECTS [place]? Depression may occur at any age, but it is more common as you become older. Common symptoms of depression include:  Low or sad mood.  Changes in sleep patterns.  Changes in appetite or eating patterns.  Feeling an overall lack of motivation or enjoyment of activities that you previously enjoyed.  Frequent crying spells. Talk with your health care provider if you think that you are experiencing depression. WHAT SHOULD I KNOW ABOUT IMMUNIZATIONS? It is important that you get and maintain your immunizations. These include:  Tetanus, diphtheria, and pertussis (Tdap) booster vaccine.  Influenza every year before the flu season begins.  Pneumonia vaccine.  Shingles vaccine. Your health care provider may also recommend other immunizations.   This information is not intended to replace advice given to you by your health care provider. Make sure you discuss any questions you have with your health care provider.   Document Released: 07/08/2005 Document Revised: 06/06/2014 Document Reviewed: 01/16/2014 Elsevier Interactive Patient Education [employer].

## 2016-02-03 NOTE — Progress Notes (Signed)
Tina Stokes [birth year] [id number]    History:    Presents for annual exam.  Cycles becoming more irregular every 1-3 months. BTL. History of small fibroids. Normal Pap and mammogram history. 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 2016 colonoscopy showed severe diverticulosis.  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 Mother hypertension, father  deceased  ROS:  A ROS was performed and pertinent positives and negatives are included.  Exam:  Vitals:   02/03/16 0855  BP: 122/80  Weight: 142 lb (64.4 kg)  Height: 5\' 1"  (1.549 m)   Body mass index is 26.83 kg/m.   General appearance:  Normal Thyroid:  Symmetrical, normal in size, without palpable masses or nodularity. Respiratory  Auscultation:  Clear without wheezing or rhonchi Cardiovascular  Auscultation:  Regular rate, without rubs, murmurs or gallops  Edema/varicosities:  Not grossly evident Abdominal  Soft,nontender, without masses, guarding or rebound.  Liver/spleen:  No organomegaly noted  Hernia:  None appreciated  Skin  Inspection:  Grossly normal   Breasts: Examined lying and sitting.     Right: Without masses, retractions, discharge or axillary adenopathy.     Left: Without masses, retractions, discharge or axillary adenopathy. Gentitourinary   Inguinal/mons:  Normal without inguinal adenopathy  External genitalia:  Normal  BUS/Urethra/Skene's glands:  Normal  Vagina:  Normal  Cervix:  Normal  Uterus: Slightly enlarged normal in size, shape and contour.  Midline and mobile  Adnexa/parametria:     Rt: Without masses or tenderness.   Lt: Without masses or tenderness.  Anus and perineum: Normal  Digital rectal exam: Normal sphincter tone without palpated masses or  tenderness  Assessment/Plan:  50 y.o. MBF G2 P2  for annual exam with no complaints.  Perimenopausal/BTL Lichen planus scalp-dermatologist Labs-primary care History of recurrent yeast 2016 severe diverticulosis noted on colonoscopy  Plan: Menopause reviewed, reviewed common to have some irregularities in cycle at 50. Minimal menopausal symptoms SBE's, continue annual 3-D screening mammogram, calcium rich diet, increase regular exercise encouraged. UA, Pap with HR HPV typing, new screening guidelines reviewed. Prescription for Diflucan 100 mg by mouth to take when necessary #15 with no refills.  Huel Cote [facility], 9:24 AM 02/03/2016

## 2016-02-04 LAB — PAP, TP IMAGING W/ HPV RNA, RFLX HPV TYPE 16,18/45: HPV MRNA, HIGH RISK: NOT DETECTED

## 2016-02-04 LAB — URINALYSIS W MICROSCOPIC + REFLEX CULTURE
BACTERIA UA: NONE SEEN [HPF]
BILIRUBIN URINE: NEGATIVE
Casts: NONE SEEN [LPF]
Crystals: NONE SEEN [HPF]
GLUCOSE, UA: NEGATIVE
HGB URINE DIPSTICK: NEGATIVE
KETONES UR: NEGATIVE
LEUKOCYTES UA: NEGATIVE
Nitrite: NEGATIVE
PH: 7 (ref 5.0–8.0)
PROTEIN: NEGATIVE
Specific Gravity, Urine: 1.017 (ref 1.001–1.035)
WBC UA: NONE SEEN WBC/HPF (ref <=5)
Yeast: NONE SEEN [HPF]

## 2016-02-05 LAB — URINE CULTURE: Organism ID, Bacteria: 10000

## 2016-04-06 ENCOUNTER — Ambulatory Visit (INDEPENDENT_AMBULATORY_CARE_PROVIDER_SITE_OTHER): Payer: BLUE CROSS/BLUE SHIELD | Admitting: Women's Health

## 2016-04-06 ENCOUNTER — Encounter: Payer: Self-pay | Admitting: Women's Health

## 2016-04-06 VITALS — BP 126/80 | Ht 61.0 in | Wt 142.0 lb

## 2016-04-06 DIAGNOSIS — R350 Frequency of micturition: Secondary | ICD-10-CM

## 2016-04-06 DIAGNOSIS — B373 Candidiasis of vulva and vagina: Secondary | ICD-10-CM | POA: Diagnosis not present

## 2016-04-06 DIAGNOSIS — B3731 Acute candidiasis of vulva and vagina: Secondary | ICD-10-CM

## 2016-04-06 DIAGNOSIS — N898 Other specified noninflammatory disorders of vagina: Secondary | ICD-10-CM

## 2016-04-06 LAB — WET PREP FOR TRICH, YEAST, CLUE
CLUE CELLS WET PREP: NONE SEEN
TRICH WET PREP: NONE SEEN

## 2016-04-06 LAB — URINALYSIS W MICROSCOPIC + REFLEX CULTURE
Bacteria, UA: NONE SEEN [HPF]
Bilirubin Urine: NEGATIVE
CASTS: NONE SEEN [LPF]
Crystals: NONE SEEN [HPF]
Glucose, UA: NEGATIVE
KETONES UR: NEGATIVE
Leukocytes, UA: NEGATIVE
NITRITE: NEGATIVE
Protein, ur: NEGATIVE
SPECIFIC GRAVITY, URINE: 1.015 (ref 1.001–1.035)
WBC, UA: NONE SEEN WBC/HPF (ref <=5)
YEAST: NONE SEEN [HPF]
pH: 7 (ref 5.0–8.0)

## 2016-04-06 MED ORDER — FLUCONAZOLE 100 MG PO TABS: ORAL_TABLET | ORAL | 0 refills | Status: DC

## 2016-04-06 NOTE — Progress Notes (Signed)
Presents with complaint of vaginal irritation with itching for the past week used Diflucan 100 mg with minimal relief. History of recurrent yeast.  Painful intercourse. Cycles mostly monthly/BTL. Cycles becoming more irregular over the past year has had 2 cycles one month and sometimes will skip 2 months. Denies urinary symptoms, abdominal pain or fever.  Exam: Appears well. External genitalia extremely erythematous, speculum exam vaginal walls erythematous scant discharge wet prep positive for yeast.  Yeast vaginitis  Plan: Diflucan 100 mg by mouth daily for 5 days and then weekly as needed. Prescription, proper use given and reviewed. Aware of yeast prevention. Instructed to call if no relief of symptoms or if cycles are less than 21 days.

## 2016-04-06 NOTE — Patient Instructions (Signed)

## 2016-04-08 LAB — URINE CULTURE

## 2016-04-11 ENCOUNTER — Encounter: Payer: Self-pay | Admitting: Family Medicine

## 2016-04-11 ENCOUNTER — Ambulatory Visit (INDEPENDENT_AMBULATORY_CARE_PROVIDER_SITE_OTHER): Payer: BLUE CROSS/BLUE SHIELD | Admitting: Family Medicine

## 2016-04-11 VITALS — BP 102/64 | HR 66 | Temp 98.3°F | Ht 61.0 in | Wt 140.0 lb

## 2016-04-11 DIAGNOSIS — M4120 Other idiopathic scoliosis, site unspecified: Secondary | ICD-10-CM | POA: Diagnosis not present

## 2016-04-11 DIAGNOSIS — M545 Low back pain: Secondary | ICD-10-CM | POA: Diagnosis not present

## 2016-04-11 DIAGNOSIS — G8929 Other chronic pain: Secondary | ICD-10-CM | POA: Diagnosis not present

## 2016-04-11 MED ORDER — CYCLOBENZAPRINE HCL 5 MG PO TABS: 5.0000 mg | ORAL_TABLET | Freq: Every evening | ORAL | 0 refills | Status: DC | PRN

## 2016-04-11 NOTE — Progress Notes (Signed)
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  Past Medical History:  Diagnosis Date  . Acne   . Arthritis   . DEPRESSION   . Fibroid uterus 07/31/2013   Right anterior fibroid 31 x 21 x 33 mm subserous, intramural 16 x 17 mm.   . Lichen planus    of the scalp - sees dermatologist in [place]   . Raynaud's disease     Past Surgical History:  Procedure Laterality Date  . COLONOSCOPY  2006   normal  . PELVIC LAPAROSCOPY    . TUBAL LIGATION  01/1999    Family History  Problem Relation Age of Onset  . Hypertension Mother   . Hyperlipidemia Mother   . Cancer Father     lung cancer  . Breast cancer Cousin     either 46 or 66  . Asthma Sister   . Hypertension Sister   . Colon cancer Neg Hx     Social History   Social History  . Marital status: Married    Spouse name: N/A  . Number of children: N/A  . Years of education: N/A   Social History Main Topics  . Smoking status: Never Smoker  . Smokeless tobacco: Never Used     Comment: Married, lives with spouse and 2 kids  . Alcohol use No  . Drug use: No  . Sexual activity: Yes    Partners: Male    Birth control/ protection: Surgical     Comment: intercourse age 37, sexual partners less than 5   Other Topics Concern  . None   Social History Narrative   Works as a Theme park manager.   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    Education: 2 years of college.    Lifestyle: no regular exercise; diet is healthy   Religion: husband  is a Theme park manager at [employer]     Current Outpatient Prescriptions:  .  gabapentin (NEURONTIN) 300 MG capsule, Take 300 mg by mouth at bedtime., Disp: , Rfl:  .  cyclobenzaprine (FLEXERIL) 5 MG tablet, Take 1 tablet (5 mg total) by mouth at bedtime as needed for muscle spasms., Disp: 30 tablet, Rfl: 0  EXAM:  Vitals:   04/11/16 1118  BP: 102/64  Pulse: 66  Temp: 98.3 F (36.8 C)    Body mass index is 26.45 kg/m.  GENERAL: vitals reviewed and listed above, alert, oriented, appears well hydrated and in no acute distress  HEENT: atraumatic, conjunttiva clear, no obvious abnormalities on inspection of external nose and ears  NECK: no obvious masses on inspection  LUNGS: clear to auscultation bilaterally, no wheezes, rales or rhonchi, good air movement  CV: HRRR, no peripheral edema  MS: moves all extremities without noticeable abnormality Normal Gait Normal inspection of back, some scoliosis present, no leg length descrepancy No bony TTP Soft tissue TTP at: Right lower lumbar paraspinal muscles, over the right PSIS -/+ tests: neg trendelenburg,-facet loading, -SLRT, -CLRT, -FABER, -FADIR Normal muscle strength,  sensation to light touch and DTRs in LEs bilaterally  PSYCH: pleasant and cooperative, no obvious depression or anxiety  ASSESSMENT AND PLAN:  Discussed the following assessment and plan:  Chronic right-sided low back pain without sciatica  Other idiopathic scoliosis, unspecified spinal region  -Her pain is most likely muscular and related to scoliosis and position during work -Discussed her strengthening exercises and postural behaviors to help- home exercise program provided to stretch hamstrings and to target the low back and core strengthening -Symptomatic care with muscle relaxer, heat, topical products and over-the-counter options as needed -Follow up in 1 month to reassess; may need MRI persistent pain -Patient advised to return or notify a  doctor immediately if symptoms worsen or persist or new concerns arise.  Patient Instructions  BEFORE YOU LEAVE: -low back exercises -follow up: 1 month  Do the exercises provided 4 days per week.  Heat for 15 minutes several times daily, a rice sock is a good option.  You can use the Flexeril at night before bed as needed.  Topical menthol (tiger balm available at [employer]) or capsaicin products can be helpful for the pain as needed.  Also can use Tylenol or Aleve per instructions as needed for pain.  Follow-up sooner if worsening or new symptoms.   Colin Benton R., DO

## 2016-04-11 NOTE — Progress Notes (Signed)
Pre visit review using our clinic review tool, if applicable. No additional management support is needed unless otherwise documented below in the visit note. 

## 2016-04-11 NOTE — Patient Instructions (Signed)
BEFORE YOU LEAVE: -low back exercises -follow up: 1 month  Do the exercises provided 4 days per week.  Heat for 15 minutes several times daily, a rice sock is a good option.  You can use the Flexeril at night before bed as needed.  Topical menthol (tiger balm available at [employer]) or capsaicin products can be helpful for the pain as needed.  Also can use Tylenol or Aleve per instructions as needed for pain.  Follow-up sooner if worsening or new symptoms.

## 2016-06-22 ENCOUNTER — Encounter: Payer: Self-pay | Admitting: Neurology

## 2016-06-22 ENCOUNTER — Ambulatory Visit (INDEPENDENT_AMBULATORY_CARE_PROVIDER_SITE_OTHER): Payer: BLUE CROSS/BLUE SHIELD | Admitting: Neurology

## 2016-06-22 VITALS — BP 100/68 | HR 77 | Ht 61.0 in | Wt 130.2 lb

## 2016-06-22 DIAGNOSIS — M792 Neuralgia and neuritis, unspecified: Secondary | ICD-10-CM

## 2016-06-22 DIAGNOSIS — L280 Lichen simplex chronicus: Secondary | ICD-10-CM

## 2016-06-22 DIAGNOSIS — R238 Other skin changes: Secondary | ICD-10-CM | POA: Diagnosis not present

## 2016-06-22 MED ORDER — NORTRIPTYLINE HCL 10 MG PO CAPS: ORAL_CAPSULE | ORAL | 5 refills | Status: DC

## 2016-06-22 NOTE — Progress Notes (Signed)
Follow-up Visit   Date: 06/22/16    Tina Stokes MRN: [id number] DOB: 08/20/1965   Interim History: Tina Stokes is a 51 y.o. left-handed African American female with history of depression and contact dermatitis returning to the clinic for follow-up of scalp discomfort and new complaints of right finger ulceration.    History of present illness: Since mid-2000s, she has been having uncontrollable itching and inflamed scalp (vertex of head). She was given steroids and neurontin which did not help. 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 About 5-years ago, she had a MRI of the brain which did not show any intracranial process. She saw a neurologist at [facility].   In 2013, she started noticing left sided facial spasm which started intermittently, but in the fall of 2014, it starting occuring 2-3 times per week and slowly improved. She says that her left eye brow will go up, lasting only a few seconds. There is no pain, but it feels "tight" as if the muscle is contracting. There is no associated pulling of the cheek, lip, or jaw. No prior history of Bell's palsy.   Follow-up 09/26/2013:  She initially saw me for facial spasm which has since resolved.  For and paresthesias EMG of the upper extremities was performed which showed bilateral median neuropathy, very mild in degree electrically. She continues to have scalp discomfort and was started on Lyrica 17m BID which seems to help but she continues to have some baseline discomfort.   UPDATE 12/31/2013:  She remains relatively unchanged and still has itching and intermittent burning of the scalp.  She continues to take Lyrica 1070mand 20085mt bedtime which is controlling symptoms relatively well, but due to insurance reasons it was stopped.    UPDATE 07/11/2014:  She reports having worsening scalp pain last week and had more  hair breakage/loss.  Upon further questioning, she states that at one point she saw rheumatology who empirically treated her with prednisone and while taking this, she was doing well, but as soon as it was discontinued, symptoms returned.  Additionally, she was also briefly tried on a chemotherapeutic agent (?methotrexate) [place] ScaKeene BreathUPDATE 10/22/2014:  She is taking gabapentin 200m68m the morning and 300mg63mbedtime, but notices burning sensation starts to worsen early evening. She is seeing her dermatologist and reports that a prednisone taper works wonders for her.  Last week, she was at church and it was very cold, she developed discoloration of her distal middle finger where it went totally white and lasted about 30-minutes.  She has since developed a small ulceration at the finger pad.  UPDATE 06/22/2016:  She returns for follow-up for the same symptoms of burning and itching scalp.  She is taking gabapentin 300mg 67medtime which provides some relief, but does not offer her any day-time benefit.  She is unable to take it during the day because of cognitive side effects.  She researched her symptoms online and is questioning whether she has lichen simplex chronicus.  She has not raised this question to her dermatologist, but admits that the only benefit she experienced was with steroids.    Medications:  Gabapentin 300mg a29mdtime  Allergies:  Allergies  Allergen Reactions  . Penicillins Rash     Review of Systems:  CONSTITUTIONAL: No fevers, chills, night sweats, or weight loss.   EYES: No visual  changes or eye pain ENT: No hearing changes.  No history of nose bleeds.   RESPIRATORY: No cough, wheezing and shortness of breath.   CARDIOVASCULAR: Negative for chest pain, and palpitations.   GI: Negative for abdominal discomfort, blood in stools or black stools.  No recent change in bowel habits.   GU:  No history of incontinence.   MUSCLOSKELETAL: No  history of joint pain or swelling.  No myalgias.   SKIN: Negative for lesions, rash, +itching.   ENDOCRINE: Negative for cold or heat intolerance, polydipsia or goiter.   PSYCH:  No depression or anxiety symptoms.   NEURO: As Above.   Vital Signs:  BP 100/68   Pulse 77   Ht '5\' 1"'  (1.549 m)   Wt 130 lb 4 oz (59.1 kg)   SpO2 98%   BMI 24.61 kg/m   Neurological Exam: MENTAL STATUS including orientation to time, place, person is normal.  Face is symmetric and she is moving all four extremities antigravity.  Gait is normal and unassisted. Formal neurological exam was deferred as all of the visit was spend discussing her symptoms and management options  Data: Labs 07/2012: ESR 10, ANA - neg Lab Results  Component Value Date   VITAMINB12 >1500 (H) 04/15/2013   Labs 09/26/2013:  SPEP/UPEP with IFE, ENA Labs 03/27/2914:  ANA neg  MRI brain with and without contrast 04/15/2013: Normal for age MRI appearance of the brain and no abnormality identified except for trace bilateral mastoid fluid, significance doubtful.  EMG of the upper extremities 05/13/2013: 1. 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  2. There is no evidence of a cervical radiculopathy affecting the right side.    IMPRESSION: ?Neurodermatitis/lichen simplex chronicus.  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 Previous scalp biopsy showed lichen planus.  I explained that management is symptom control, which is what we are already doing.  Greatest benefit tends to be with localized steroid injections, which I do not do and recommend she ask her dermatologist.  Long term oral corticosteroids can be an alternative option, but with its adverse effects, systemic steroids are not favored.  In the meantime, I will offer her a trial of nortriptyline since she does not  tolerate higher dose of gabapentin.   PLAN/RECOMMENDATIONS:  1.  Stop gabapentin  2.  Start nortriptyline 37m at bedtime  3.  Follow-up with dermatology to discuss localized steroid injections  Return to clinic in 4 months   The duration of this appointment visit was 25 minutes of face-to-face time with the patient.  Greater than 50% of this time was spent in counseling, explanation of diagnosis, planning of further management, and coordination of care.   Thank you for allowing me to participate in patient's care.  If I can answer any additional questions, I would be pleased to do so.    Sincerely,    Demarian Epps K. PPosey Pronto DO

## 2016-06-22 NOTE — Patient Instructions (Addendum)
Start nortriptyline 10mg  at bedtime for 2 week, then increase to 2 tablet at bedtime Stop gabapentin   Return to clinic in 4 months

## 2016-07-11 ENCOUNTER — Ambulatory Visit: Payer: BLUE CROSS/BLUE SHIELD | Admitting: Neurology

## 2016-09-07 ENCOUNTER — Telehealth: Payer: Self-pay

## 2016-09-07 ENCOUNTER — Other Ambulatory Visit: Payer: Self-pay | Admitting: Gynecology

## 2016-09-07 MED ORDER — ALPRAZOLAM 0.25 MG PO TABS: 0.2500 mg | ORAL_TABLET | Freq: Three times a day (TID) | ORAL | 0 refills | Status: DC

## 2016-09-07 NOTE — Telephone Encounter (Signed)
Patient said a couple of years ago you prescribed her some Xanax for travel related anxiety. She only took a few of them but they have expired. She said she will be traveling in the next few weeks and asked if you would send Rx again for her. She is willing to come talk with you if needed.

## 2016-09-07 NOTE — Telephone Encounter (Signed)
Okay for prescription for Xanax 0.25  Every 8 hours prn  ( travel anxiety)  #15 no refills.

## 2016-09-07 NOTE — Telephone Encounter (Signed)
Rx called into pharmacy. Patient informed.

## 2016-10-12 ENCOUNTER — Encounter: Payer: Self-pay | Admitting: Gynecology

## 2016-10-17 ENCOUNTER — Ambulatory Visit: Payer: BLUE CROSS/BLUE SHIELD | Admitting: Neurology

## 2016-11-28 LAB — HM MAMMOGRAPHY

## 2016-12-01 ENCOUNTER — Encounter: Payer: Self-pay | Admitting: Family Medicine

## 2017-02-08 ENCOUNTER — Ambulatory Visit (INDEPENDENT_AMBULATORY_CARE_PROVIDER_SITE_OTHER): Payer: BLUE CROSS/BLUE SHIELD | Admitting: Women's Health

## 2017-02-08 ENCOUNTER — Encounter: Payer: Self-pay | Admitting: Women's Health

## 2017-02-08 VITALS — BP 117/79 | Ht 61.0 in | Wt 137.0 lb

## 2017-02-08 DIAGNOSIS — Z01419 Encounter for gynecological examination (general) (routine) without abnormal findings: Secondary | ICD-10-CM

## 2017-02-08 NOTE — Progress Notes (Signed)
Tina Stokes [birth year] [id number]    History:    Presents for annual exam. Cycles continue to be irregular, but mostly monthly, LMP 07/18- lasts for 3 days with first two days heavier. . Has noticed brief hot flashes and mood changes over last few weeks. Normal Pap and mammogram history; Continues to have issues with lichen planus/scalp and currently on gabapentin- following up with PCP and neurology appropriately. 2016 colonoscopy showed diverticulosis- occasional constipation which she takes Miralex prn. Also issues with tendonitis in L shoulder and DD in R shoulder which an MRI scheduled.   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 Discussed healthier menu options at [employer]. Daughter recently moved to college culinary school in [place]-. Sleeping well, especially with gabapentin, and enjoys going to see a movie for leisure time.   ROS:  A ROS was performed and pertinent positives and negatives are included.  Exam:  Vitals:   02/08/17 0931  BP: 117/79  Weight: 137 lb (62.1 kg)  Height: 5\' 1"  (1.549 m)   Body mass index is 25.89 kg/m.   General appearance:  Normal Thyroid:  Symmetrical, normal in size, without palpable masses or nodularity. Respiratory  Auscultation:  Clear without wheezing or rhonchi Cardiovascular  Auscultation:  Regular rate, without rubs, murmurs or gallops  Edema/varicosities:  Not grossly evident Abdominal  Soft,nontender, without masses, guarding or rebound.  Liver/spleen:  No organomegaly noted  Hernia:  None appreciated  Skin  Inspection:  Grossly normal   Breasts: Examined lying and sitting.     Right: Without masses, retractions, discharge or axillary adenopathy.     Left: Without masses, retractions, discharge or axillary  adenopathy. Gentitourinary   Inguinal/mons:  Normal without inguinal adenopathy  External genitalia:  Normal  BUS/Urethra/Skene's glands:  Normal  Vagina:  Normal  Cervix:  Normal  Uterus:  anteverted, normal in size, shape and contour.  Midline and mobile  Adnexa/parametria:     Rt: Without masses or tenderness.   Lt: Without masses or tenderness.  Anus and perineum: Normal  Digital rectal exam: Normal sphincter tone without palpated masses or tenderness  Assessment/Plan:  51 y.o.MBF G2P2 for annual exam.  No concerns today.  Mostly montly cycles/BTL Shoulder pain- follow up scheduled Lichen planus/scalp-gabapentin primary care managing  Plan: Continue to maintain weight and encouraged to incorporate exercise into routine. Discussed menopausal changes. Discussed diverticulosis and ways to prevent constipation. Continue annual 3-D screening mammogram, SBE's, and calcium rich diet.  CBC, CMP, not fasting lipid lipid panel next year, Pap normal with negative HR HPV 2017, new screening guidelines reviewed.    [place], 10:16 AM 02/08/2017

## 2017-02-08 NOTE — Patient Instructions (Signed)

## 2017-02-09 LAB — CBC WITH DIFFERENTIAL/PLATELET
BASOS PCT: 0.1 %
Basophils Absolute: 7 cells/uL (ref 0–200)
Eosinophils Absolute: 0 cells/uL — ABNORMAL LOW (ref 15–500)
Eosinophils Relative: 0 %
HCT: 36.6 % (ref 35.0–45.0)
Hemoglobin: 12.5 g/dL (ref 11.7–15.5)
Lymphs Abs: 1034 cells/uL (ref 850–3900)
MCH: 30.3 pg (ref 27.0–33.0)
MCHC: 34.2 g/dL (ref 32.0–36.0)
MCV: 88.8 fL (ref 80.0–100.0)
MPV: 10.6 fL (ref 7.5–12.5)
Monocytes Relative: 4.9 %
Neutro Abs: 5426 cells/uL (ref 1500–7800)
Neutrophils Relative %: 79.8 %
PLATELETS: 279 10*3/uL (ref 140–400)
RBC: 4.12 10*6/uL (ref 3.80–5.10)
RDW: 13 % (ref 11.0–15.0)
Total Lymphocyte: 15.2 %
WBC: 6.8 10*3/uL (ref 3.8–10.8)
WBCMIX: 333 {cells}/uL (ref 200–950)

## 2017-02-09 LAB — COMPREHENSIVE METABOLIC PANEL
AG Ratio: 2 (calc) (ref 1.0–2.5)
ALBUMIN MSPROF: 4.5 g/dL (ref 3.6–5.1)
ALKALINE PHOSPHATASE (APISO): 41 U/L (ref 33–130)
ALT: 8 U/L (ref 6–29)
AST: 11 U/L (ref 10–35)
BUN: 12 mg/dL (ref 7–25)
CHLORIDE: 107 mmol/L (ref 98–110)
CO2: 24 mmol/L (ref 20–32)
Calcium: 9.2 mg/dL (ref 8.6–10.4)
Creat: 0.78 mg/dL (ref 0.50–1.05)
Globulin: 2.2 g/dL (calc) (ref 1.9–3.7)
Glucose, Bld: 102 mg/dL — ABNORMAL HIGH (ref 65–99)
Potassium: 4 mmol/L (ref 3.5–5.3)
Sodium: 141 mmol/L (ref 135–146)
Total Bilirubin: 0.3 mg/dL (ref 0.2–1.2)
Total Protein: 6.7 g/dL (ref 6.1–8.1)

## 2017-02-16 ENCOUNTER — Encounter: Payer: Self-pay | Admitting: Family Medicine

## 2017-06-08 ENCOUNTER — Encounter: Payer: Self-pay | Admitting: Family Medicine

## 2017-07-26 DIAGNOSIS — I781 Nevus, non-neoplastic: Secondary | ICD-10-CM | POA: Insufficient documentation

## 2017-08-23 ENCOUNTER — Other Ambulatory Visit: Payer: Self-pay | Admitting: Orthopedic Surgery

## 2017-11-27 ENCOUNTER — Ambulatory Visit: Payer: BLUE CROSS/BLUE SHIELD | Admitting: Women's Health

## 2017-11-27 ENCOUNTER — Encounter: Payer: Self-pay | Admitting: Women's Health

## 2017-11-27 DIAGNOSIS — B373 Candidiasis of vulva and vagina: Secondary | ICD-10-CM

## 2017-11-27 DIAGNOSIS — B3731 Acute candidiasis of vulva and vagina: Secondary | ICD-10-CM

## 2017-11-27 DIAGNOSIS — R350 Frequency of micturition: Secondary | ICD-10-CM

## 2017-11-27 LAB — WET PREP FOR TRICH, YEAST, CLUE

## 2017-11-27 MED ORDER — FLUCONAZOLE 100 MG PO TABS: ORAL_TABLET | ORAL | 0 refills | Status: DC

## 2017-11-27 NOTE — Progress Notes (Signed)
History: 52 y/o  MBF G3P2 presents for complaint of vaginal irritation/rash with itching for 3 days. History of recurrent yeast with last occurrence 03/2016. Menstrual cycles irregular with LMP 11/07/17 which lasted about 3-4 days. Cycle prior to that was 04/2017. BTL/same partner. Denies urinary symptoms, abdominal pain, or fever.  History of lichens planus of the scalp, remission.  Exam: Appears well. External genitalia with mild erythema right labia. Speculum exam with moderate white, thick discharge. No odor. Wet prep negative for yeast, trichomonas, or clue cells.  UA: nitrates negative, leukocytes negative, WBC 0-5, RBC 0, squamous epithelial 6-10, few bacteria, no yeast.  Probable yeast vaginitis  Plan: Discuss exam findings. Prescription for Diflucan 200 mg today and repeat in 3 days if needed. Proper use given. Discuss yeast prevention. Instructed to call if no relief of symptoms.

## 2017-11-27 NOTE — Patient Instructions (Signed)

## 2017-11-29 ENCOUNTER — Encounter (INDEPENDENT_AMBULATORY_CARE_PROVIDER_SITE_OTHER): Payer: Self-pay

## 2017-11-29 LAB — URINALYSIS, COMPLETE W/RFL CULTURE
Bilirubin Urine: NEGATIVE
Glucose, UA: NEGATIVE
HYALINE CAST: NONE SEEN /LPF
Hgb urine dipstick: NEGATIVE
Ketones, ur: NEGATIVE
Leukocyte Esterase: NEGATIVE
NITRITES URINE, INITIAL: NEGATIVE
PH: 7 (ref 5.0–8.0)
Protein, ur: NEGATIVE
RBC / HPF: NONE SEEN /HPF (ref 0–2)
SPECIFIC GRAVITY, URINE: 1.015 (ref 1.001–1.03)

## 2017-11-29 LAB — CULTURE INDICATED

## 2017-11-29 LAB — URINE CULTURE
MICRO NUMBER:: 90786675
SPECIMEN QUALITY: ADEQUATE

## 2017-11-29 LAB — HM MAMMOGRAPHY

## 2017-12-05 ENCOUNTER — Encounter: Payer: Self-pay | Admitting: Family Medicine

## 2018-02-12 ENCOUNTER — Encounter: Payer: Self-pay | Admitting: Women's Health

## 2018-02-12 ENCOUNTER — Ambulatory Visit: Payer: BLUE CROSS/BLUE SHIELD | Admitting: Women's Health

## 2018-02-12 VITALS — BP 118/80 | Ht 61.0 in | Wt 143.0 lb

## 2018-02-12 DIAGNOSIS — Z01419 Encounter for gynecological examination (general) (routine) without abnormal findings: Secondary | ICD-10-CM | POA: Diagnosis not present

## 2018-02-12 DIAGNOSIS — Z13220 Encounter for screening for lipoid disorders: Secondary | ICD-10-CM

## 2018-02-12 DIAGNOSIS — Z23 Encounter for immunization: Secondary | ICD-10-CM | POA: Diagnosis not present

## 2018-02-12 NOTE — Progress Notes (Signed)
Tina Stokes 10/27/1965 [id number]    History:    Presents for annual exam.  Cycles becoming more irregular the last cycle June.  Occasional menopausal symptoms.  Normal Pap and mammogram history.  History of scoliosis, arthralgia, and lichen planus on scalp.  2016- colonoscopy.  History of 2 small fibroids largest being 3 cm.  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  ROS:  A ROS was performed and pertinent positives and negatives are included.  Exam:  Vitals:   02/12/18 0840  BP: 118/80  Weight: 143 lb (64.9 kg)  Height: 5\' 1"  (1.549 m)   Body mass index is 27.02 kg/m.   General appearance:  Normal Thyroid:  Symmetrical, normal in size, without palpable masses or nodularity. Respiratory  Auscultation:  Clear without wheezing or rhonchi Cardiovascular  Auscultation:  Regular rate, without rubs, murmurs or gallops  Edema/varicosities:  Not grossly evident Abdominal  Soft,nontender, without masses, guarding or rebound.  Liver/spleen:  No organomegaly noted  Hernia:  None appreciated  Skin  Inspection:  Grossly normal   Breasts: Examined lying and sitting.     Right: Without masses, retractions, discharge or axillary adenopathy.     Left: Without masses, retractions, discharge or axillary adenopathy. Gentitourinary   Inguinal/mons:  Normal without inguinal adenopathy  External genitalia:  Normal  BUS/Urethra/Skene's glands:  Normal  Vagina:  Normal  Cervix:  Normal  Uterus:  normal in size, shape and contour.  Midline and mobile  Adnexa/parametria:     Rt: Without masses or tenderness.   Lt: Without masses or tenderness.  Anus and perineum: Normal  Digital rectal exam: Normal sphincter tone without palpated masses or tenderness  Assessment/Plan:  52 y.o. MBF G3, P2 for  annual exam no complaints  Perimenopausal on no HRT/BTL Arthralgias Lichen plan to scalp dermatologist managing  Plan: Menopause discussed.  SBEs, annual screening mammogram, calcium rich foods, vitamin D 2000 daily encouraged.  Reviewed importance of increasing exercise decreasing calorie/carbs, has had some weight gain over the past few years.  CBC, CMP, lipid panel, Pap normal 2017, new screening guidelines reviewed.   [place], 8:45 AM 02/12/2018

## 2018-02-12 NOTE — Patient Instructions (Signed)
Health Maintenance for Postmenopausal Women Menopause is a normal process in which your reproductive ability comes to an end. This process happens gradually over a span of months to years, usually between the ages of 22 and 9. Menopause is complete when you have missed 12 consecutive menstrual periods. It is important to talk with your health care provider about some of the most common conditions that affect postmenopausal women, such as heart disease, cancer, and bone loss (osteoporosis). Adopting a healthy lifestyle and getting preventive care can help to promote your health and wellness. Those actions can also lower your chances of developing some of these common conditions. What should I know about menopause? During menopause, you may experience a number of symptoms, such as:  Moderate-to-severe hot flashes.  Night sweats.  Decrease in sex drive.  Mood swings.  Headaches.  Tiredness.  Irritability.  Memory problems.  Insomnia.  Choosing to treat or not to treat menopausal changes is an individual decision that you make with your health care provider. What should I know about hormone replacement therapy and supplements? Hormone therapy products are effective for treating symptoms that are associated with menopause, such as hot flashes and night sweats. Hormone replacement carries certain risks, especially as you become older. If you are thinking about using estrogen or estrogen with progestin treatments, discuss the benefits and risks with your health care provider. What should I know about heart disease and stroke? Heart disease, heart attack, and stroke become more likely as you age. This may be due, in part, to the hormonal changes that your body experiences during menopause. These can affect how your body processes dietary fats, triglycerides, and cholesterol. Heart attack and stroke are both medical emergencies. There are many things that you can do to help prevent heart disease  and stroke:  Have your blood pressure checked at least every 1-2 years. High blood pressure causes heart disease and increases the risk of stroke.  If you are 53-22 years old, ask your health care provider if you should take aspirin to prevent a heart attack or a stroke.  Do not use any tobacco products, including cigarettes, chewing tobacco, or electronic cigarettes. If you need help quitting, ask your health care provider.  It is important to eat a healthy diet and maintain a healthy weight. ? Be sure to include plenty of vegetables, fruits, low-fat dairy products, and lean protein. ? Avoid eating foods that are high in solid fats, added sugars, or salt (sodium).  Get regular exercise. This is one of the most important things that you can do for your health. ? Try to exercise for at least 150 minutes each week. The type of exercise that you do should increase your heart rate and make you sweat. This is known as moderate-intensity exercise. ? Try to do strengthening exercises at least twice each week. Do these in addition to the moderate-intensity exercise.  Know your numbers.Ask your health care provider to check your cholesterol and your blood glucose. Continue to have your blood tested as directed by your health care provider.  What should I know about cancer screening? There are several types of cancer. Take the following steps to reduce your risk and to catch any cancer development as early as possible. Breast Cancer  Practice breast self-awareness. ? This means understanding how your breasts normally appear and feel. ? It also means doing regular breast self-exams. Let your health care provider know about any changes, no matter how small.  If you are 40  or older, have a clinician do a breast exam (clinical breast exam or CBE) every year. Depending on your age, family history, and medical history, it may be recommended that you also have a yearly breast X-ray (mammogram).  If you  have a family history of breast cancer, talk with your health care provider about genetic screening.  If you are at high risk for breast cancer, talk with your health care provider about having an MRI and a mammogram every year.  Breast cancer (BRCA) gene test is recommended for women who have family members with BRCA-related cancers. Results of the assessment will determine the need for genetic counseling and BRCA1 and for BRCA2 testing. BRCA-related cancers include these types: ? Breast. This occurs in males or females. ? Ovarian. ? Tubal. This may also be called fallopian tube cancer. ? Cancer of the abdominal or pelvic lining (peritoneal cancer). ? Prostate. ? Pancreatic.  Cervical, Uterine, and Ovarian Cancer Your health care provider may recommend that you be screened regularly for cancer of the pelvic organs. These include your ovaries, uterus, and vagina. This screening involves a pelvic exam, which includes checking for microscopic changes to the surface of your cervix (Pap test).  For women ages 21-65, health care providers may recommend a pelvic exam and a Pap test every three years. For women ages 79-65, they may recommend the Pap test and pelvic exam, combined with testing for human papilloma virus (HPV), every five years. Some types of HPV increase your risk of cervical cancer. Testing for HPV may also be done on women of any age who have unclear Pap test results.  Other health care providers may not recommend any screening for nonpregnant women who are considered low risk for pelvic cancer and have no symptoms. Ask your health care provider if a screening pelvic exam is right for you.  If you have had past treatment for cervical cancer or a condition that could lead to cancer, you need Pap tests and screening for cancer for at least 20 years after your treatment. If Pap tests have been discontinued for you, your risk factors (such as having a new sexual partner) need to be  reassessed to determine if you should start having screenings again. Some women have medical problems that increase the chance of getting cervical cancer. In these cases, your health care provider may recommend that you have screening and Pap tests more often.  If you have a family history of uterine cancer or ovarian cancer, talk with your health care provider about genetic screening.  If you have vaginal bleeding after reaching menopause, tell your health care provider.  There are currently no reliable tests available to screen for ovarian cancer.  Lung Cancer Lung cancer screening is recommended for adults 69-62 years old who are at high risk for lung cancer because of a history of smoking. A yearly low-dose CT scan of the lungs is recommended if you:  Currently smoke.  Have a history of at least 30 pack-years of smoking and you currently smoke or have quit within the past 15 years. A pack-year is smoking an average of one pack of cigarettes per day for one year.  Yearly screening should:  Continue until it has been 15 years since you quit.  Stop if you develop a health problem that would prevent you from having lung cancer treatment.  Colorectal Cancer  This type of cancer can be detected and can often be prevented.  Routine colorectal cancer screening usually begins at  age 42 and continues through age 45.  If you have risk factors for colon cancer, your health care provider may recommend that you be screened at an earlier age.  If you have a family history of colorectal cancer, talk with your health care provider about genetic screening.  Your health care provider may also recommend using home test kits to check for hidden blood in your stool.  A small camera at the end of a tube can be used to examine your colon directly (sigmoidoscopy or colonoscopy). This is done to check for the earliest forms of colorectal cancer.  Direct examination of the colon should be repeated every  5-10 years until age 71. However, if early forms of precancerous polyps or small growths are found or if you have a family history or genetic risk for colorectal cancer, you may need to be screened more often.  Skin Cancer  Check your skin from head to toe regularly.  Monitor any moles. Be sure to tell your health care provider: ? About any new moles or changes in moles, especially if there is a change in a mole's shape or color. ? If you have a mole that is larger than the size of a pencil eraser.  If any of your family members has a history of skin cancer, especially at a young age, talk with your health care provider about genetic screening.  Always use sunscreen. Apply sunscreen liberally and repeatedly throughout the day.  Whenever you are outside, protect yourself by wearing long sleeves, pants, a wide-brimmed hat, and sunglasses.  What should I know about osteoporosis? Osteoporosis is a condition in which bone destruction happens more quickly than new bone creation. After menopause, you may be at an increased risk for osteoporosis. To help prevent osteoporosis or the bone fractures that can happen because of osteoporosis, the following is recommended:  If you are 46-71 years old, get at least 1,000 mg of calcium and at least 600 mg of vitamin D per day.  If you are older than age 55 but younger than age 65, get at least 1,200 mg of calcium and at least 600 mg of vitamin D per day.  If you are older than age 54, get at least 1,200 mg of calcium and at least 800 mg of vitamin D per day.  Smoking and excessive alcohol intake increase the risk of osteoporosis. Eat foods that are rich in calcium and vitamin D, and do weight-bearing exercises several times each week as directed by your health care provider. What should I know about how menopause affects my mental health? Depression may occur at any age, but it is more common as you become older. Common symptoms of depression  include:  Low or sad mood.  Changes in sleep patterns.  Changes in appetite or eating patterns.  Feeling an overall lack of motivation or enjoyment of activities that you previously enjoyed.  Frequent crying spells.  Talk with your health care provider if you think that you are experiencing depression. What should I know about immunizations? It is important that you get and maintain your immunizations. These include:  Tetanus, diphtheria, and pertussis (Tdap) booster vaccine.  Influenza every year before the flu season begins.  Pneumonia vaccine.  Shingles vaccine.  Your health care provider may also recommend other immunizations. This information is not intended to replace advice given to you by your health care provider. Make sure you discuss any questions you have with your health care provider. Document Released: 07/08/2005  Document Revised: 12/04/2015 Document Reviewed: 02/17/2015 Elsevier Interactive Patient Education  2018 [employer]vier Inc.  

## 2018-04-04 ENCOUNTER — Telehealth: Payer: Self-pay

## 2018-04-04 NOTE — Telephone Encounter (Signed)
Copied from [place] [id number]. Topic: Referral - Request for Referral >> Apr 04, 2018  1:46 PM Judyann Munson wrote: Has patient seen PCP for this complaint?  Yes  Referral for which specialty: Rheumatology  Preferred provider/office: Dr. 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

## 2018-04-06 NOTE — Telephone Encounter (Signed)
I called the pt and informed her of the message below.  Appt scheduled for 11/11.

## 2018-04-06 NOTE — Telephone Encounter (Signed)
It looks like I have not seen this patient since 2017? Advise appt. Thanks.

## 2018-04-08 NOTE — Progress Notes (Signed)
HPI:  Using dictation device. Unfortunately this device frequently misinterprets words/phrases.  Tina Stokes is a pleasant 52 yo whom I have not seen in several years here for an acute visit for a "rheumatology referral." In the past she was treated with prednisone and chemotherapeutic agents for a chronic skin rash - was seen by dermatology and rheumatology. Hx of ulcerated skin lesions as well. Hx of polyarthragias as well with Neg autoimmune and partial inflammatory workup with neurologist in the past. We referred her to rheumatology in 2016.  Reports chronic irritation and discomfort in the scalp.  Has seen several dermatologist including [facility] Dw[facility] D[facility] and a dermatologist at [facility].  Reports has been given a diagnosis of lichen planus after several biopsies.  She has also seen a rheumatologist remotely.  She has tried a number of treatments including oral steroids, steroid injections, methotrexate, gabapentin and Cymbalta.  She also has tried numerous topical treatments.  The only thing that helped, was steroids.  She wants a referral to Dr. Estanislado Pandy for a second opinion.  She is quite frustrated.  Denies skin problems elsewhere, other health issues, malaise, fevers, weight loss.   ROS: See pertinent positives and negatives per HPI.  Past Medical History:  Diagnosis Date  . Acne   . Arthritis   . DEPRESSION   . Fibroid uterus 07/31/2013   Right anterior fibroid 31 x 21 x 33 mm subserous, intramural 16 x 17 mm.   . Lichen planus    of the scalp - sees dermatologist in [place]   . Raynaud's disease     Past Surgical History:  Procedure Laterality Date  . COLONOSCOPY  2006   normal  . PELVIC LAPAROSCOPY    . TUBAL LIGATION  01/1999    Family History  Problem Relation Age of Onset  . Hypertension Mother   . Hyperlipidemia Mother   . Cancer Father        lung cancer  . Breast cancer Cousin        either 51 or 57  . Asthma Sister   . Hypertension  Sister   . Colon cancer Neg Hx     SOCIAL HX: See HPI   Current Outpatient Medications:  .  clobetasol (OLUX) 0.05 % topical foam, Apply topically 2 (two) times daily., Disp: 50 g, Rfl: 0  EXAM:  Vitals:   04/09/18 0802  BP: 118/70  Pulse: 71  Temp: 98.3 F (36.8 C)    Body mass index is 27.15 kg/m.  GENERAL: vitals reviewed and listed above, alert, oriented, appears well hydrated and in no acute distress  HEENT: atraumatic, conjunttiva clear, no obvious abnormalities on inspection of external nose and ears  NECK: no obvious masses on inspection  LUNGS: clear to auscultation bilaterally, no wheezes, rales or rhonchi, good air movement  CV: HRRR, no peripheral edema  SKIN: no abnormalities of the skin or hair seen on scalp  MS: moves all extremities without noticeable abnormality  PSYCH: pleasant and cooperative, no obvious depression or anxiety  ASSESSMENT AND PLAN:  Discussed the following assessment and plan:  Lichen planus - Plan: clobetasol (OLUX) 0.05 % topical foam, Ambulatory referral to Rheumatology  -trial nightly antihistamine, topical steroid since she reports relief with steroids, healthy diet, regular exercise, manage stress if present -referral placed per her request, but I am not sure if this will be useful given it seems she has tried many treatments - will see if anything to add -CPE in  3 months -Patient advised to return or notify a doctor immediately if symptoms worsen or persist or new concerns arise.  Patient Instructions  BEFORE YOU LEAVE: -follow up: 3 months for CPE  Start zyrtec and take nightly (available over the counter.)  Olux to affected area 1-2 times daily.  Mediterranean diet.  Regular exercise.  Seek help if any stress, anxiety or depression.  We placed a referral for you as discussed. It usually takes about 1-2 weeks to process and schedule this referral. If you have not heard from [place] regarding this appointment in 2  weeks please contact our office.       Lucretia Kern, DO

## 2018-04-09 ENCOUNTER — Telehealth: Payer: Self-pay | Admitting: *Deleted

## 2018-04-09 ENCOUNTER — Ambulatory Visit: Payer: BLUE CROSS/BLUE SHIELD | Admitting: Family Medicine

## 2018-04-09 ENCOUNTER — Encounter: Payer: Self-pay | Admitting: Family Medicine

## 2018-04-09 VITALS — BP 118/70 | HR 71 | Temp 98.3°F | Ht 61.0 in | Wt 143.7 lb

## 2018-04-09 DIAGNOSIS — L439 Lichen planus, unspecified: Secondary | ICD-10-CM | POA: Diagnosis not present

## 2018-04-09 MED ORDER — CLOBETASOL PROPIONATE 0.05 % EX FOAM: Freq: Two times a day (BID) | CUTANEOUS | 0 refills | Status: DC

## 2018-04-09 NOTE — Patient Instructions (Signed)
BEFORE YOU LEAVE: -follow up: 3 months for CPE  Start zyrtec and take nightly (available over the counter.)  Olux to affected area 1-2 times daily.  Mediterranean diet.  Regular exercise.  Seek help if any stress, anxiety or depression.  We placed a referral for you as discussed. It usually takes about 1-2 weeks to process and schedule this referral. If you have not heard from [place] regarding this appointment in 2 weeks please contact our office.

## 2018-04-09 NOTE — Telephone Encounter (Signed)
Prior auth for Clobetasol Propionate 0.05% foam sent to [email or web address]-key [id number].

## 2018-04-09 NOTE — Telephone Encounter (Signed)
Levada Dy from [employer] calling to request the names of the other topical medications patient tried before Clobetasol Propionate.   Cb# [phone]

## 2018-04-10 ENCOUNTER — Telehealth: Payer: Self-pay | Admitting: Family Medicine

## 2018-04-10 NOTE — Telephone Encounter (Signed)
[employer] needs prior authorization for pt Clobetasol

## 2018-04-10 NOTE — Telephone Encounter (Signed)
See prior note

## 2018-04-10 NOTE — Telephone Encounter (Signed)
I left a detailed message for the pt stating the insurance will not cover the foam and asked that she call back with the names of the previous medications she has tried.

## 2018-04-10 NOTE — Telephone Encounter (Signed)
Copied from [place] [id number]. Topic: General - Other >> Apr 10, 2018 11:01 AM Leward Quan A wrote: Reason for CRM:  Colletta Maryland with [facility] called regarding prior authorization for clobetasol (OLUX) 0.05 % topical foam . Was reaching out to see what other topical products has she used. Ph# [phone]  Key# [id number]

## 2018-04-11 ENCOUNTER — Telehealth: Payer: Self-pay | Admitting: Family Medicine

## 2018-04-11 NOTE — Telephone Encounter (Signed)
Copied from [place] [phone] [phone]. Topic: General - Other >> Apr 11, 2018 10:41 AM Keene Breath wrote: Reason for CRM: Claiborne Billings with [facility] called to inform the doctor that they received a request for clobetasol (OLUX) 0.05 % topical foam, but it has missing information on the form.  Please advise.  CB# [phone]

## 2018-04-11 NOTE — Telephone Encounter (Signed)
I called the pt and and asked the information below.  She stated someone at her pharmacy told her our office needed to call for this info.  I called [facility], spoke with Linna Hoff and was given the name of Betamethasone 0.05% lotion.  I called [employer] at the number below and spoke with Annesha N and gave her this info.  Later spoke with Varney Biles and she stated the review team has this information and will contact our office with a determination.

## 2018-04-25 NOTE — Progress Notes (Signed)
Office Visit Note  Patient: Tina Stokes             Date of Birth: [birth year]           MRN: [id number]             PCP: Lucretia Kern, DO Referring: Lucretia Kern, DO Visit Date: 05/09/2018 Occupation: Hairstylist  Subjective:  Tina Stokes and knee pain.   History of Present Illness: Tina Stokes is a 52 y.o. female seen in consultation per request of Dr. Maudie Mercury.  According to patient she has had history of joint pain and Tina for last 10 years.  She states she has had recurrent problems with Raynaud's over the last 10 years which is been worse in the last 3 years.  She developed right middle finger digital ulcer about a year ago.  She states Dr. Amedeo Plenty evaluated her and told her that it was because of a blood clot.  She states her finger was also infected and required some surgery.  She has not had recurrence of the digital ulcer.  Feet to stay cold as well.  She is also had pain in her bilateral knee joints for the last 10 years.  She states she has occasional swelling and had one-time cortisone injection to her right knee joint.  None of the other joints are painful.  She states in 2005 she had her hair braided which got infected.  Since then she had seen several dermatologist she was treated for possible fungal infection possible bacterial infection.  She also had biopsies done which were positive for lichen planus.  She has been seen at the [facility] and [facility].  She is also seen dermatologist here locally.  She has been tried on oral steroids which has been effective for the duration she takes it.  The steroid injections have failed.  She continues to have itching pain and inflammation for which she has tried Neurontin in the past.  She has been tried on methotrexate for couple of months which did not help.  Currently she is using only topical steroids.  Activities of Daily Living:  Patient reports morning stiffness for 2 minutes.   Patient  Denies nocturnal pain.  Difficulty dressing/grooming: Denies Difficulty climbing stairs: Reports Difficulty getting out of chair: Denies Difficulty using hands for taps, buttons, cutlery, and/or writing: Denies  Review of Systems  Constitutional: Negative for fatigue, night sweats, weight gain and weight loss.  HENT: Negative for mouth sores, trouble swallowing, trouble swallowing, mouth dryness and nose dryness.   Eyes: Negative for pain, redness, visual disturbance and dryness.  Respiratory: Negative for cough, shortness of breath and difficulty breathing.   Cardiovascular: Negative for chest pain, palpitations, hypertension, irregular heartbeat and swelling in legs/feet.  Gastrointestinal: Positive for constipation. Negative for blood in stool and diarrhea.  Endocrine: Negative for increased urination.  Genitourinary: Negative for vaginal dryness.  Musculoskeletal: Positive for arthralgias, joint pain and morning stiffness. Negative for joint swelling, myalgias, muscle weakness, muscle tenderness and myalgias.  Skin: Positive for color change and hair loss. Negative for rash, skin tightness, ulcers and sensitivity to sunlight.  Allergic/Immunologic: Negative for susceptible to infections.  Neurological: Negative for dizziness, memory loss, night sweats and weakness.  Hematological: Negative for swollen glands.  Psychiatric/Behavioral: Positive for depressed mood. Negative for sleep disturbance. The patient is not nervous/anxious.     PMFS History:  Patient Active Problem List   Diagnosis Date Noted  . Lichen planus 82/95/6213  .  Raynaud's syndrome without gangrene 05/09/2018  . Other idiopathic scoliosis 04/11/2016  . Subserous leiomyoma of uterus 10/05/2015  . Mood disorder ([place]) 11/10/2014  . Acne   . Anemia 12/28/2009    Past Medical History:  Diagnosis Date  . Acne   . Arthritis   . DEPRESSION   . Fibroid uterus 07/31/2013   Right anterior fibroid 31 x 21 x 33 mm  subserous, intramural 16 x 17 mm.   . Lichen planus    of the scalp - sees dermatologist in [place]   . Raynaud's disease     Family History  Problem Relation Age of Onset  . Hypertension Mother   . Hyperlipidemia Mother   . Cancer Father        lung cancer  . Breast cancer Cousin        either 40 or 69  . Asthma Sister   . Hypertension Sister   . Healthy Brother   . Hashimoto's thyroiditis Son   . Vitiligo Son   . Autoimmune disease Son   . Hyperthyroidism Daughter   . Colon cancer Neg Hx    Past Surgical History:  Procedure Laterality Date  . COLONOSCOPY  2006   normal  . PELVIC LAPAROSCOPY    . TUBAL LIGATION  01/1999   Social History   Social History Narrative   Works as a Theme park manager.   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    Education: 2 years of college.    Lifestyle: no regular exercise; diet is healthy   Religion: husband is a Theme park manager at [employer]    Objective: Vital Signs: BP 115/72 (BP Location: Right Arm, Patient Position: Sitting, Cuff Size: Normal)   Pulse 67   Resp 12   Ht 5\' 1"  (1.549 m)   Wt 147 lb 12.8 oz (67 kg)   BMI 27.93 kg/m    Physical Exam  Constitutional: She is oriented to person, place, and time. She appears well-developed and well-nourished.  HENT:  Head: Normocephalic and atraumatic.  Eyes: Conjunctivae and EOM are normal.  Neck: Normal range of motion.  Cardiovascular: Normal rate, regular rhythm, normal heart sounds and intact distal pulses.  Pulmonary/Chest: Effort normal and breath sounds normal.  Abdominal: Soft. Bowel sounds are normal.  Lymphadenopathy:    She has no cervical adenopathy.  Neurological: She is alert and oriented to person, place, and time.  Skin: Skin is warm and dry. Capillary refill takes 2 to 3 seconds.  Patchy hair loss  Psychiatric: She has a normal mood and affect. Her behavior is normal.  Nursing note and vitals reviewed.     Musculoskeletal Exam: C-spine thoracic lumbar spine good range of motion.  Shoulder joints elbow joints wrist joint MCPs PIPs DIPs been good range of motion.  She is some tenderness across her PIPs.  Hip joints, knee joints, ankles, MTPs PIPs been good range of motion.  She has painful range of motion of bilateral knee joints without any warmth swelling or effusion.  CDAI Exam: CDAI Score: Not documented Patient Global Assessment: Not documented; Provider Global Assessment: Not documented Swollen: Not documented; Tender: Not documented Joint Exam   Not documented   There is currently no information documented on the homunculus. Go to the Rheumatology activity and complete the homunculus joint exam.  Investigation: No additional findings.  Imaging: Xr Hand 2 View Left  Result Date: 05/09/2018 Minimal PIP narrowing was noted.  No MCP, intercarpal radiocarpal  joint space narrowing was noted.  No erosive changes were noted. Impression: These findings are consistent mild osteoarthritis of the hand.  Xr Hand 2 View Right  Result Date: 05/09/2018 Minimal PIP narrowing was noted.  No MCP, intercarpal radiocarpal joint space narrowing was noted.  No erosive changes were noted. Impression: These findings are consistent mild osteoarthritis of the hand.  Xr Knee 3 View Left  Result Date: 05/09/2018 Moderate medial compartment narrowing and intercondylar osteophytes were noted.  No chondrocalcinosis was noted.  Moderate patellofemoral narrowing was noted. Impression: These findings are consistent moderate osteoarthritis and moderate chondromalacia patella.  Xr Knee 3 View Right  Result Date: 05/09/2018 Moderate medial compartment narrowing and intercondylar osteophytes were noted.  No chondrocalcinosis was noted.  Moderate patellofemoral narrowing was noted. Impression: These findings are consistent moderate osteoarthritis and moderate chondromalacia patella.   Recent Labs: Lab Results   Component Value Date   WBC 6.8 02/08/2017   HGB 12.5 02/08/2017   PLT 279 02/08/2017   NA 141 02/08/2017   K 4.0 02/08/2017   CL 107 02/08/2017   CO2 24 02/08/2017   GLUCOSE 102 (H) 02/08/2017   BUN 12 02/08/2017   CREATININE 0.78 02/08/2017   BILITOT 0.3 02/08/2017   ALKPHOS 42 12/28/2009   AST 11 02/08/2017   ALT 8 02/08/2017   PROT 6.7 02/08/2017   ALBUMIN 3.8 12/28/2009   CALCIUM 9.2 02/08/2017  07/19 UA negative  Speciality Comments: No specialty comments available.  Procedures:  No procedures performed Allergies: Patient has no known allergies.   Assessment / Plan:     Visit Diagnoses: Raynaud's syndrome without gangrene -patient gives history of Tina Stokes for the last 10 years.  She had a digital ulcer last year which was treated by Dr. Amedeo Plenty.  Patient reports that she had secondary infection.  I will obtain following labs to evaluate for any underlying autoimmune process.  Plan: CK, Sedimentation rate, Rheumatoid factor, Cyclic citrul peptide antibody, IgG, ANA, Uric acid, Anti-scleroderma antibody, RNP Antibody, Anti-Smith antibody, Sjogrens syndrome-A extractable nuclear antibody, Sjogrens syndrome-B extractable nuclear antibody, Anti-DNA antibody, double-stranded, C3 and C4, Beta-2 glycoprotein antibodies, Cardiolipin antibodies, IgG, IgM, IgA, Lupus Anticoagulant Eval w/Reflex, Cryoglobulin, Hepatitis B core antibody, IgM, Hepatitis B surface antigen, Hepatitis C antibody, Pan-ANCA, Glucose 6 phosphate dehydrogenase.  If she continues to have problems with Tina we may add Norvasc to her medication regimen.  Pain in both hands -she complains of pain and discomfort in her hands.  I do not see any synovitis on examination.  Plan: XR Hand 2 View Right, XR Hand 2 View Left.  X-ray showed mild osteoarthritic changes in the PIP joints.  Chronic pain of both knees -she has chronic pain in her bilateral knee joints for the last 10 years.  She states she has  difficulty climbing stairs.  Plan: XR KNEE 3 VIEW RIGHT, XR KNEE 3 VIEW LEFT, the x-ray showed bilateral moderate osteoarthritis and bilateral moderate chondromalacia patella.  We discussed the option of using topical diclofenac gel.  Patient was in agreement.  Side effects were discussed.  A prescription was given.  Natural anti-inflammatories were discussed.  A handout on knee joint exercises was given.  Weight loss diet and exercise was discussed.  Sedimentation rate, Rheumatoid factor, Cyclic citrul peptide antibody, IgG  Hair loss -patient has seen several dermatologist in the past and was diagnosed with lichen planus.  She has tried methotrexate, prednisone and multiple other medications without good results.  Plan: TSH, VITAMIN D 25 Hydroxy (  Vit-D Deficiency, Fractures)  Lichen planus-she is currently using topical steroids.  History of anemia  History of depression -related to hair loss.  Orders: Orders Placed This Encounter  Procedures  . XR Hand 2 View Right  . XR Hand 2 View Left  . XR KNEE 3 VIEW RIGHT  . XR KNEE 3 VIEW LEFT  . CK  . TSH  . VITAMIN D 25 Hydroxy (Vit-D Deficiency, Fractures)  . Sedimentation rate  . Rheumatoid factor  . Cyclic citrul peptide antibody, IgG  . ANA  . Uric acid  . Anti-scleroderma antibody  . RNP Antibody  . Anti-Smith antibody  . Sjogrens syndrome-A extractable nuclear antibody  . Sjogrens syndrome-B extractable nuclear antibody  . Anti-DNA antibody, double-stranded  . C3 and C4  . Beta-2 glycoprotein antibodies  . Cardiolipin antibodies, IgG, IgM, IgA  . Lupus Anticoagulant Eval w/Reflex  . Cryoglobulin  . Hepatitis B core antibody, IgM  . Hepatitis B surface antigen  . Hepatitis C antibody  . Pan-ANCA  . Glucose 6 phosphate dehydrogenase   Meds ordered this encounter  Medications  . diclofenac sodium (VOLTAREN) 1 % GEL    Sig: 3 grams to 3 large joints up to 3 times daily    Dispense:  3 Tube    Refill:  3    Face-to-face  time spent with patient was 45 minutes. Greater than 50% of time was spent in counseling and coordination of care.  Follow-Up Instructions: Return for Tina.   Bo Merino, MD  Note - This record has been created using Editor, commissioning.  Chart creation errors have been sought, but may not always  have been located. Such creation errors do not reflect on  the standard of medical care.

## 2018-05-09 ENCOUNTER — Ambulatory Visit (INDEPENDENT_AMBULATORY_CARE_PROVIDER_SITE_OTHER): Payer: Self-pay

## 2018-05-09 ENCOUNTER — Encounter: Payer: Self-pay | Admitting: Rheumatology

## 2018-05-09 ENCOUNTER — Ambulatory Visit: Payer: BLUE CROSS/BLUE SHIELD | Admitting: Rheumatology

## 2018-05-09 ENCOUNTER — Ambulatory Visit (INDEPENDENT_AMBULATORY_CARE_PROVIDER_SITE_OTHER): Payer: BLUE CROSS/BLUE SHIELD

## 2018-05-09 VITALS — BP 115/72 | HR 67 | Resp 12 | Ht 61.0 in | Wt 147.8 lb

## 2018-05-09 DIAGNOSIS — G8929 Other chronic pain: Secondary | ICD-10-CM

## 2018-05-09 DIAGNOSIS — L659 Nonscarring hair loss, unspecified: Secondary | ICD-10-CM | POA: Diagnosis not present

## 2018-05-09 DIAGNOSIS — M25561 Pain in right knee: Secondary | ICD-10-CM | POA: Diagnosis not present

## 2018-05-09 DIAGNOSIS — L439 Lichen planus, unspecified: Secondary | ICD-10-CM

## 2018-05-09 DIAGNOSIS — M79642 Pain in left hand: Secondary | ICD-10-CM

## 2018-05-09 DIAGNOSIS — M79641 Pain in right hand: Secondary | ICD-10-CM

## 2018-05-09 DIAGNOSIS — Z8659 Personal history of other mental and behavioral disorders: Secondary | ICD-10-CM

## 2018-05-09 DIAGNOSIS — I7300 Raynaud's syndrome without gangrene: Secondary | ICD-10-CM

## 2018-05-09 DIAGNOSIS — M25562 Pain in left knee: Secondary | ICD-10-CM | POA: Diagnosis not present

## 2018-05-09 DIAGNOSIS — Z862 Personal history of diseases of the blood and blood-forming organs and certain disorders involving the immune mechanism: Secondary | ICD-10-CM

## 2018-05-09 MED ORDER — DICLOFENAC SODIUM 1 % TD GEL: TRANSDERMAL | 3 refills | Status: DC

## 2018-05-09 NOTE — Patient Instructions (Signed)

## 2018-05-10 NOTE — Progress Notes (Signed)
Vit D 50,000 U twice a week X 3 months. Recheck in 3 months.

## 2018-05-14 ENCOUNTER — Telehealth: Payer: Self-pay | Admitting: *Deleted

## 2018-05-14 DIAGNOSIS — E559 Vitamin D deficiency, unspecified: Secondary | ICD-10-CM

## 2018-05-14 MED ORDER — VITAMIN D (ERGOCALCIFEROL) 1.25 MG (50000 UNIT) PO CAPS: 50000.0000 [IU] | ORAL_CAPSULE | ORAL | 0 refills | Status: DC

## 2018-05-14 NOTE — Telephone Encounter (Signed)
-----   Message from Bo Merino, MD sent at 05/10/2018  8:21 AM EST ----- Vit D 50,000 U twice a week X 3 months. Recheck in 3 months.

## 2018-05-16 ENCOUNTER — Other Ambulatory Visit: Payer: Self-pay | Admitting: Pharmacist

## 2018-05-16 ENCOUNTER — Encounter: Payer: Self-pay | Admitting: Rheumatology

## 2018-05-16 ENCOUNTER — Telehealth: Payer: Self-pay | Admitting: *Deleted

## 2018-05-16 ENCOUNTER — Ambulatory Visit: Payer: BLUE CROSS/BLUE SHIELD | Admitting: Rheumatology

## 2018-05-16 VITALS — BP 110/71 | HR 63 | Resp 13 | Ht 61.0 in | Wt 148.4 lb

## 2018-05-16 DIAGNOSIS — M170 Bilateral primary osteoarthritis of knee: Secondary | ICD-10-CM | POA: Insufficient documentation

## 2018-05-16 DIAGNOSIS — Z79899 Other long term (current) drug therapy: Secondary | ICD-10-CM

## 2018-05-16 DIAGNOSIS — L659 Nonscarring hair loss, unspecified: Secondary | ICD-10-CM

## 2018-05-16 DIAGNOSIS — L439 Lichen planus, unspecified: Secondary | ICD-10-CM

## 2018-05-16 DIAGNOSIS — Z8659 Personal history of other mental and behavioral disorders: Secondary | ICD-10-CM

## 2018-05-16 DIAGNOSIS — Z862 Personal history of diseases of the blood and blood-forming organs and certain disorders involving the immune mechanism: Secondary | ICD-10-CM

## 2018-05-16 DIAGNOSIS — M19041 Primary osteoarthritis, right hand: Secondary | ICD-10-CM | POA: Diagnosis not present

## 2018-05-16 DIAGNOSIS — M19042 Primary osteoarthritis, left hand: Secondary | ICD-10-CM

## 2018-05-16 DIAGNOSIS — I7300 Raynaud's syndrome without gangrene: Secondary | ICD-10-CM | POA: Diagnosis not present

## 2018-05-16 LAB — LUPUS ANTICOAGULANT EVAL W/ REFLEX
DRVVT: 39 s (ref <=45)
PTT-LA Screen: 35 s (ref <=40)

## 2018-05-16 LAB — RNP ANTIBODY: Ribonucleic Protein(ENA) Antibody, IgG: <1 AI

## 2018-05-16 LAB — RHEUMATOID FACTOR: Rheumatoid fact SerPl-aCnc: <14 IU/mL (ref <14)

## 2018-05-16 LAB — ANA: Anti Nuclear Antibody(ANA): POSITIVE — AB

## 2018-05-16 LAB — C3 AND C4
C3 Complement: 142 mg/dL (ref 83–193)
C4 Complement: 34 mg/dL (ref 15–57)

## 2018-05-16 LAB — GLUCOSE 6 PHOSPHATE DEHYDROGENASE: G-6PDH: 13.2 U/g Hgb (ref 7.0–20.5)

## 2018-05-16 LAB — ANTI-DNA ANTIBODY, DOUBLE-STRANDED: ds DNA Ab: 2 IU/mL

## 2018-05-16 LAB — VITAMIN D 25 HYDROXY (VIT D DEFICIENCY, FRACTURES): VIT D 25 HYDROXY: 18 ng/mL — AB (ref 30–100)

## 2018-05-16 LAB — URIC ACID: Uric Acid, Serum: 4.5 mg/dL (ref 2.5–7.0)

## 2018-05-16 LAB — PAN-ANCA
ANCA Screen: NEGATIVE
Serine Protease 3: <1 AI

## 2018-05-16 LAB — HEPATITIS B CORE ANTIBODY, IGM: HEP B C IGM: NONREACTIVE

## 2018-05-16 LAB — ANTI-SMITH ANTIBODY: ENA SM Ab Ser-aCnc: <1 AI

## 2018-05-16 LAB — CYCLIC CITRUL PEPTIDE ANTIBODY, IGG: Cyclic Citrullin Peptide Ab: >250 UNITS — ABNORMAL HIGH

## 2018-05-16 LAB — ANTI-SCLERODERMA ANTIBODY: SCLERODERMA (SCL-70) (ENA) ANTIBODY, IGG: NEGATIVE AI

## 2018-05-16 LAB — HEPATITIS C ANTIBODY
Hepatitis C Ab: NONREACTIVE
SIGNAL TO CUT-OFF: 0.01 (ref <1.00)

## 2018-05-16 LAB — TSH: TSH: 2.28 mIU/L

## 2018-05-16 LAB — ANTI-NUCLEAR AB-TITER (ANA TITER): ANA Titer 1: 1:80 {titer} — ABNORMAL HIGH

## 2018-05-16 LAB — HEPATITIS B SURFACE ANTIGEN: Hepatitis B Surface Ag: NONREACTIVE

## 2018-05-16 LAB — CRYOGLOBULIN: Cryoglobulin, Qualitative Analysis: NOT DETECTED

## 2018-05-16 LAB — BETA-2 GLYCOPROTEIN ANTIBODIES
Beta-2 Glyco 1 IgA: <9 SAU (ref <=20)
Beta-2 Glyco 1 IgM: <9 SMU (ref <=20)
Beta-2 Glyco I IgG: <9 SGU (ref <=20)

## 2018-05-16 LAB — SEDIMENTATION RATE: Sed Rate: 17 mm/h (ref 0–30)

## 2018-05-16 LAB — CARDIOLIPIN ANTIBODIES, IGG, IGM, IGA
Anticardiolipin IgA: <11 [APL'U]
Anticardiolipin IgG: <14 [GPL'U]
Anticardiolipin IgM: 19 [MPL'U] — ABNORMAL HIGH

## 2018-05-16 LAB — SJOGRENS SYNDROME-A EXTRACTABLE NUCLEAR ANTIBODY: SSA (Ro) (ENA) Antibody, IgG: <1 AI

## 2018-05-16 LAB — SJOGRENS SYNDROME-B EXTRACTABLE NUCLEAR ANTIBODY: SSB (La) (ENA) Antibody, IgG: <1 AI

## 2018-05-16 LAB — CK: Total CK: 67 U/L (ref 29–143)

## 2018-05-16 NOTE — Telephone Encounter (Signed)
Prior Authorization submitted via cover my meds for Voltaren Gel. Will update once response received.  

## 2018-05-16 NOTE — Telephone Encounter (Signed)
Voltaren Gel Approved and patient advised.

## 2018-05-16 NOTE — Progress Notes (Signed)
Pharmacy Note  Subjective: Patient presents today to the [facility] to see Dr. Estanislado Pandy.  Patient seen by the pharmacist for counseling on hydroxychloroquine for Raynaud's.    Objective: CMP     Component Value Date/Time   NA 141 02/08/2017 1016   K 4.0 02/08/2017 1016   CL 107 02/08/2017 1016   CO2 24 02/08/2017 1016   GLUCOSE 102 (H) 02/08/2017 1016   BUN 12 02/08/2017 1016   CREATININE 0.78 02/08/2017 1016   CALCIUM 9.2 02/08/2017 1016   PROT 6.7 02/08/2017 1016   ALBUMIN 3.8 12/28/2009 1445   AST 11 02/08/2017 1016   ALT 8 02/08/2017 1016   ALKPHOS 42 12/28/2009 1445   BILITOT 0.3 02/08/2017 1016   GFRNONAA 87.02 12/28/2009 1445    CBC    Component Value Date/Time   WBC 6.8 02/08/2017 1016   RBC 4.12 02/08/2017 1016   HGB 12.5 02/08/2017 1016   HCT 36.6 02/08/2017 1016   PLT 279 02/08/2017 1016   MCV 88.8 02/08/2017 1016   MCH 30.3 02/08/2017 1016   MCHC 34.2 02/08/2017 1016   RDW 13.0 02/08/2017 1016   LYMPHSABS 1,034 02/08/2017 1016   MONOABS 0.3 11/10/2014 0849   EOSABS 0 (L) 02/08/2017 1016   BASOSABS 7 02/08/2017 1016    Assessment/Plan: Patient was prescribed hydroxychloroquine 200 mg twice daily Monday through Friday.  Patient was counseled on the purpose, proper use, and adverse effects of hydroxychloroquine including nausea/diarrhea, skin rash, headaches, and sun sensitivity.  Discussed importance of annual eye exams while on hydroxychloroquine to monitor to ocular toxicity and discussed importance of frequent laboratory monitoring.  Provided patient with eye exam form for baseline ophthalmologic exam and standing lab instructions.  Provided patient with educational materials on hydroxychloroquine and answered all questions.  Patient consented to hydroxychloroquine.  Will upload consent in the media tab.    Patient is also to take enteric coated baby aspirin with food daily.  Prescription pending CBC/CMP results.  All questions  encouraged and answered.  Instructed patient to call with any further questions or concerns.  Mariella Saa, PharmD, [facility] Rheumatology Clinical Pharmacist  05/16/2018 2:28 PM

## 2018-05-16 NOTE — Progress Notes (Signed)
Office Visit Note  Patient: Tina Stokes             Date of Birth: Oct 12, 1965           MRN: [id number]             PCP: Lucretia Kern, DO Referring: Lucretia Kern, DO Visit Date: 05/16/2018 Occupation: @GUAROCC @  Subjective:  Raynauds and joint pain.   History of Present Illness: Tina Stokes is a 52 y.o. female with history of raynauds phenomenon and osteoarthritis.  She states recently the discoloration in her hands is not as bad as the weather is not so cold.  She continues to have some discomfort in her hands and knee joints due to underlying osteoarthritis.  She notices some swelling in her right knee joint.  Activities of Daily Living:  Patient reports morning stiffness for 2 minutes.   Patient Denies nocturnal pain.  Difficulty dressing/grooming: Denies Difficulty climbing stairs: Reports Difficulty getting out of chair: Reports Difficulty using hands for taps, buttons, cutlery, and/or writing: Denies  Review of Systems  Constitutional: Positive for fatigue. Negative for night sweats, weight gain and weight loss.  HENT: Negative for mouth sores, trouble swallowing, trouble swallowing, mouth dryness and nose dryness.   Eyes: Negative for pain, redness, visual disturbance and dryness.  Respiratory: Negative for cough, shortness of breath and difficulty breathing.   Cardiovascular: Negative for chest pain, palpitations, hypertension, irregular heartbeat and swelling in legs/feet.  Gastrointestinal: Negative for blood in stool, constipation and diarrhea.  Endocrine: Negative for increased urination.  Genitourinary: Negative for vaginal dryness.  Musculoskeletal: Positive for arthralgias, joint pain and morning stiffness. Negative for joint swelling, myalgias, muscle weakness, muscle tenderness and myalgias.  Skin: Positive for color change, rash and hair loss. Negative for skin tightness, ulcers and sensitivity to sunlight.  Allergic/Immunologic: Negative for  susceptible to infections.  Neurological: Negative for dizziness, memory loss, night sweats and weakness.  Hematological: Negative for swollen glands.  Psychiatric/Behavioral: Negative for depressed mood and sleep disturbance. The patient is not nervous/anxious.     PMFS History:  Patient Active Problem List   Diagnosis Date Noted  . History of depression 05/16/2018  . Primary osteoarthritis of both knees 05/16/2018  . Primary osteoarthritis of both hands 05/16/2018  . Lichen planus 40/98/1191  . Raynaud's syndrome without gangrene 05/09/2018  . Other idiopathic scoliosis 04/11/2016  . Subserous leiomyoma of uterus 10/05/2015  . Mood disorder ([place]) 11/10/2014  . Acne   . Anemia 12/28/2009    Past Medical History:  Diagnosis Date  . Acne   . Arthritis   . DEPRESSION   . Fibroid uterus 07/31/2013   Right anterior fibroid 31 x 21 x 33 mm subserous, intramural 16 x 17 mm.   . Lichen planus    of the scalp - sees dermatologist in [place]   . Raynaud's disease     Family History  Problem Relation Age of Onset  . Hypertension Mother   . Hyperlipidemia Mother   . Cancer Father        lung cancer  . Breast cancer Cousin        either 52 or 26  . Asthma Sister   . Hypertension Sister   . Healthy Brother   . Hashimoto's thyroiditis Son   . Vitiligo Son   . Autoimmune disease Son   . Hyperthyroidism Daughter   . Colon cancer Neg Hx    Past Surgical History:  Procedure Laterality  Date  . COLONOSCOPY  2006   normal  . PELVIC LAPAROSCOPY    . TUBAL LIGATION  01/1999   Social History   Social History Narrative   Works as a Theme park manager.   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    Education: 2 years of college.    Lifestyle: no regular exercise; diet is healthy   Religion: husband is a Theme park manager at [employer]    Objective: Vital Signs: BP 110/71 (BP Location: Left Arm, Patient Position: Sitting, Cuff Size: Normal)    Pulse 63   Resp 13   Ht 5\' 1"  (1.549 m)   Wt 148 lb 6.4 oz (67.3 kg)   BMI 28.04 kg/m    Physical Exam Vitals signs and nursing note reviewed.  Constitutional:      Appearance: She is well-developed.  HENT:     Head: Normocephalic and atraumatic.  Eyes:     Conjunctiva/sclera: Conjunctivae normal.  Neck:     Musculoskeletal: Normal range of motion.  Cardiovascular:     Rate and Rhythm: Normal rate and regular rhythm.     Heart sounds: Normal heart sounds.  Pulmonary:     Effort: Pulmonary effort is normal.     Breath sounds: Normal breath sounds.  Abdominal:     General: Bowel sounds are normal.     Palpations: Abdomen is soft.  Lymphadenopathy:     Cervical: No cervical adenopathy.  Skin:    General: Skin is warm and dry.     Capillary Refill: Capillary refill takes less than 2 seconds.  Neurological:     Mental Status: She is alert and oriented to person, place, and time.  Psychiatric:        Behavior: Behavior normal.      Musculoskeletal Exam: C-spine thoracic lumbar spine good range of motion.  Shoulder joints elbow joints wrist joints MCPs PIPs DIPs with good range of motion with no synovitis.  Hip joints knee joints ankles MTPs PIPs been good range of motion with no synovitis.  She had some discomfort range of motion of her right knee joint.  CDAI Exam: CDAI Score: Not documented Patient Global Assessment: Not documented; Provider Global Assessment: Not documented Swollen: Not documented; Tender: Not documented Joint Exam   Not documented   There is currently no information documented on the homunculus. Go to the Rheumatology activity and complete the homunculus joint exam.  Investigation: No additional findings.  Imaging: Xr Hand 2 View Left  Result Date: 05/09/2018 Minimal PIP narrowing was noted.  No MCP, intercarpal radiocarpal joint space narrowing was noted.  No erosive changes were noted. Impression: These findings are consistent mild  osteoarthritis of the hand.  Xr Hand 2 View Right  Result Date: 05/09/2018 Minimal PIP narrowing was noted.  No MCP, intercarpal radiocarpal joint space narrowing was noted.  No erosive changes were noted. Impression: These findings are consistent mild osteoarthritis of the hand.  Xr Knee 3 View Left  Result Date: 05/09/2018 Moderate medial compartment narrowing and intercondylar osteophytes were noted.  No chondrocalcinosis was noted.  Moderate patellofemoral narrowing was noted. Impression: These findings are consistent moderate osteoarthritis and moderate chondromalacia patella.  Xr Knee 3 View Right  Result Date: 05/09/2018 Moderate medial compartment narrowing and intercondylar osteophytes were noted.  No chondrocalcinosis was noted.  Moderate patellofemoral narrowing was noted. Impression: These findings are consistent moderate osteoarthritis and moderate chondromalacia patella.   Recent Labs: Lab Results  Component Value Date  WBC 6.8 02/08/2017   HGB 12.5 02/08/2017   PLT 279 02/08/2017   NA 141 02/08/2017   K 4.0 02/08/2017   CL 107 02/08/2017   CO2 24 02/08/2017   GLUCOSE 102 (H) 02/08/2017   BUN 12 02/08/2017   CREATININE 0.78 02/08/2017   BILITOT 0.3 02/08/2017   ALKPHOS 42 12/28/2009   AST 11 02/08/2017   ALT 8 02/08/2017   PROT 6.7 02/08/2017   ALBUMIN 3.8 12/28/2009   CALCIUM 9.2 02/08/2017  Lupus anticoagulant negative, beta-2 GP 1-, anticardiolipin IgM 19, C3-C4 normal, ANA 1: 80 nuclear speckled, ENA negative, cryoglobulins negative,  pan-ANCA negative, uric acid 4.5, G6PD normal, hepatitis B-, hepatitis C negative  Speciality Comments: No specialty comments available.  Procedures:  No procedures performed Allergies: Patient has no known allergies.   Assessment / Plan:     Visit Diagnoses: Raynaud's syndrome without gangrene - History of digital ulcers in the past.  ANA positive, ENA negative, anticardiolipin IgM 19.  I had detailed discussion with  patient regarding Raynolds phenomenon and positive ANA.  We discussed possible trial of Plaquenil.  She was in agreement.  She also has history of intermittent joint swelling and fatigue.  I would like to see if her symptoms improve.  She believes that she has taken Plaquenil several years ago for hair loss.  Indications side effects contraindications were discussed at length.  Need for immunization including flu vaccine pneumococcal vaccine and Shingrix vaccine was also discussed.  The plan is to start her on Plaquenil 200 mg p.o. twice daily Monday through Friday after her baseline labs will be obtained today.  She has been also advised to get baseline eye examination and then yearly eye examination.  Primary osteoarthritis of both hands-she continues to have some stiffness in her hands but no synovitis was noted.  Primary osteoarthritis of both knees - Bilateral moderate osteoarthritis and bilateral moderate chondromalacia patella.  She gives history of intermittent swelling in her right knee joint.  Hair loss - Diagnosed with lichen planus.  Treated with prednisone and methotrexate in the past.  Lichen planus  History of depression  History of anemia  High risk medication use-we will obtain CBC and CMP today.  Orders: Orders Placed This Encounter  Procedures  . CBC with Differential/Platelet  . COMPLETE METABOLIC PANEL WITH GFR  . CBC with Differential/Platelet  . COMPLETE METABOLIC PANEL WITH GFR   No orders of the defined types were placed in this encounter.   Face-to-face time spent with patient was 30 minutes. Greater than 50% of time was spent in counseling and coordination of care.  Follow-Up Instructions: Return in about 3 months (around 08/15/2018) for Raynauds, Osteoarthritis.   Bo Merino, MD  Note - This record has been created using Editor, commissioning.  Chart creation errors have been sought, but may not always  have been located. Such creation errors do not  reflect on  the standard of medical care.

## 2018-05-16 NOTE — Patient Instructions (Addendum)
Plaquenil will be called in pending lab results.  Please starting taking aspirin enteric-coated 81 mg daily.  Standing Labs We placed an order today for your standing lab work.    Please come back and get your standing labs in 1 month, 3 months, and then every 5 months.  We have open lab Monday through Friday from 8:30-11:30 AM and 1:30-4:00 PM  at the office of Dr. Bo Merino.   You may experience shorter wait times on Monday and Friday afternoons. The office is located at [address] [place], [place], [place] [address] No appointment is necessary.   Labs are drawn by [facility].  You may receive a bill from [facility] for your lab work.  If you wish to have your labs drawn at another location, please call the office 24 hours in advance to send orders.  If you have any questions regarding directions or hours of operation,  please call [phone].   Just as a reminder please drink plenty of water prior to coming for your lab work. Thanks!  Vaccines You are taking a medication(s) that can suppress your immune system.  The following immunizations are recommended: . Flu annually . Pneumonia (Pneumovax 23 and Prevnar 13 spaced at least 1 year apart) . Shingrix  Please check with your PCP to make sure you are up to date.  Hydroxychloroquine tablets What is this medicine? HYDROXYCHLOROQUINE (hye drox ee KLOR oh kwin) is used to treat rheumatoid arthritis and systemic lupus erythematosus. It is also used to treat malaria. This medicine may be used for other purposes; ask your health care provider or pharmacist if you have questions. COMMON BRAND NAME(S): Plaquenil, Quineprox What should I tell my health care provider before I take this medicine? They need to know if you have any of these conditions: -diabetes -eye disease, vision problems -G6PD deficiency -history of blood diseases -history of irregular heartbeat -if you often drink alcohol -kidney disease -liver  disease -porphyria -psoriasis -seizures -an unusual or allergic reaction to chloroquine, hydroxychloroquine, other medicines, foods, dyes, or preservatives -pregnant or trying to get pregnant -breast-feeding How should I use this medicine? Take this medicine by mouth with a glass of water. Follow the directions on the prescription label. Avoid taking antacids within 4 hours of taking this medicine. It is best to separate these medicines by at least 4 hours. Do not cut, crush or chew this medicine. You can take it with or without food. If it upsets your stomach, take it with food. Take your medicine at regular intervals. Do not take your medicine more often than directed. Take all of your medicine as directed even if you think you are better. Do not skip doses or stop your medicine early. Talk to your pediatrician regarding the use of this medicine in children. While this drug may be prescribed for selected conditions, precautions do apply. Overdosage: If you think you have taken too much of this medicine contact a poison control center or emergency room at once. NOTE: This medicine is only for you. Do not share this medicine with others. What if I miss a dose? If you miss a dose, take it as soon as you can. If it is almost time for your next dose, take only that dose. Do not take double or extra doses. What may interact with this medicine? Do not take this medicine with any of the following medications: -cisapride -dofetilide -dronedarone -live virus vaccines -penicillamine -pimozide -thioridazine -ziprasidone This medicine may also interact with the following  medications: -ampicillin -antacids -cimetidine -cyclosporine -digoxin -medicines for diabetes, like insulin, glipizide, glyburide -medicines for seizures like carbamazepine, phenobarbital, phenytoin -mefloquine -methotrexate -other medicines that prolong the QT interval (cause an abnormal heart rhythm) -praziquantel This  list may not describe all possible interactions. Give your health care provider a list of all the medicines, herbs, non-prescription drugs, or dietary supplements you use. Also tell them if you smoke, drink alcohol, or use illegal drugs. Some items may interact with your medicine. What should I watch for while using this medicine? Tell your doctor or healthcare professional if your symptoms do not start to get better or if they get worse. Avoid taking antacids within 4 hours of taking this medicine. It is best to separate these medicines by at least 4 hours. Tell your doctor or health care professional right away if you have any change in your eyesight. Your vision and blood may be tested before and during use of this medicine. This medicine can make you more sensitive to the sun. Keep out of the sun. If you cannot avoid being in the sun, wear protective clothing and use sunscreen. Do not use sun lamps or tanning beds/booths. What side effects may I notice from receiving this medicine? Side effects that you should report to your doctor or health care professional as soon as possible: -allergic reactions like skin rash, itching or hives, swelling of the face, lips, or tongue -changes in vision -decreased hearing or ringing of the ears -redness, blistering, peeling or loosening of the skin, including inside the mouth -seizures -sensitivity to light -signs and symptoms of a dangerous change in heartbeat or heart rhythm like chest pain; dizziness; fast or irregular heartbeat; palpitations; feeling faint or lightheaded, falls; breathing problems -signs and symptoms of liver injury like dark yellow or brown urine; general ill feeling or flu-like symptoms; light-colored stools; loss of appetite; nausea; right upper belly pain; unusually weak or tired; yellowing of the eyes or skin -signs and symptoms of low blood sugar such as feeling anxious; confusion; dizziness; increased hunger; unusually weak or  tired; sweating; shakiness; cold; irritable; headache; blurred vision; fast heartbeat; loss of consciousness -uncontrollable head, mouth, neck, arm, or leg movements Side effects that usually do not require medical attention (report to your doctor or health care professional if they continue or are bothersome): -anxious -diarrhea -dizziness -hair loss -headache -irritable -loss of appetite -nausea, vomiting -stomach pain This list may not describe all possible side effects. Call your doctor for medical advice about side effects. You may report side effects to FDA at 1-800-FDA-1088. Where should I keep my medicine? Keep out of the reach of children. In children, this medicine can cause overdose with small doses. Store at room temperature between 15 and 30 degrees C (59 and 86 degrees F). Protect from moisture and light. Throw away any unused medicine after the expiration date. NOTE: This sheet is a summary. It may not cover all possible information. If you have questions about this medicine, talk to your doctor, pharmacist, or health care provider.  2019 Elsevier/Gold Standard (2015-12-30 14:16:15)

## 2018-05-16 NOTE — Telephone Encounter (Signed)
Prescription pending CBC/CMP results.  Plaquenil 200 mg twice daily Monday-Friday.

## 2018-05-16 NOTE — Progress Notes (Signed)
Test positive ANA and positive anti-CCP.  Please to schedule an earlier appointment if possible.  I would like to start her on Plaquenil.

## 2018-05-17 LAB — COMPLETE METABOLIC PANEL WITH GFR
AG Ratio: 1.6 (calc) (ref 1.0–2.5)
ALT: 10 U/L (ref 6–29)
AST: 13 U/L (ref 10–35)
Albumin: 4 g/dL (ref 3.6–5.1)
Alkaline phosphatase (APISO): 50 U/L (ref 33–130)
BUN: 10 mg/dL (ref 7–25)
CO2: 29 mmol/L (ref 20–32)
Calcium: 9.5 mg/dL (ref 8.6–10.4)
Chloride: 106 mmol/L (ref 98–110)
Creat: 0.8 mg/dL (ref 0.50–1.05)
GFR, Est African American: 98 mL/min/{1.73_m2} (ref >=60)
GFR, Est Non African American: 84 mL/min/{1.73_m2} (ref >=60)
Globulin: 2.5 g/dL (calc) (ref 1.9–3.7)
Glucose, Bld: 77 mg/dL (ref 65–99)
Potassium: 4.2 mmol/L (ref 3.5–5.3)
Sodium: 142 mmol/L (ref 135–146)
Total Bilirubin: 0.3 mg/dL (ref 0.2–1.2)
Total Protein: 6.5 g/dL (ref 6.1–8.1)

## 2018-05-17 LAB — CBC WITH DIFFERENTIAL/PLATELET
Absolute Monocytes: 344 cells/uL (ref 200–950)
Basophils Absolute: 8 cells/uL (ref 0–200)
Basophils Relative: 0.2 %
Eosinophils Absolute: 164 cells/uL (ref 15–500)
Eosinophils Relative: 3.9 %
HCT: 36 % (ref 35.0–45.0)
Hemoglobin: 12.3 g/dL (ref 11.7–15.5)
Lymphs Abs: 1651 cells/uL (ref 850–3900)
MCH: 30 pg (ref 27.0–33.0)
MCHC: 34.2 g/dL (ref 32.0–36.0)
MCV: 87.8 fL (ref 80.0–100.0)
MPV: 10.6 fL (ref 7.5–12.5)
Monocytes Relative: 8.2 %
Neutro Abs: 2033 cells/uL (ref 1500–7800)
Neutrophils Relative %: 48.4 %
PLATELETS: 296 10*3/uL (ref 140–400)
RBC: 4.1 10*6/uL (ref 3.80–5.10)
RDW: 12.8 % (ref 11.0–15.0)
Total Lymphocyte: 39.3 %
WBC: 4.2 10*3/uL (ref 3.8–10.8)

## 2018-05-17 MED ORDER — HYDROXYCHLOROQUINE SULFATE 200 MG PO TABS: ORAL_TABLET | ORAL | 0 refills | Status: DC

## 2018-05-17 NOTE — Telephone Encounter (Signed)
Labs are back and WNL.

## 2018-06-13 ENCOUNTER — Ambulatory Visit: Payer: BLUE CROSS/BLUE SHIELD | Admitting: Rheumatology

## 2018-08-01 ENCOUNTER — Other Ambulatory Visit: Payer: Self-pay | Admitting: Rheumatology

## 2018-08-01 NOTE — Progress Notes (Deleted)
Office Visit Note  Patient: Tina Stokes             Date of Birth: 02-May-1966           MRN: [id number]             PCP: Lucretia Kern, DO Referring: Lucretia Kern, DO Visit Date: 08/15/2018 Occupation: @GUAROCC @  Subjective:  No chief complaint on file.  Plaquenil 200 mg twice daily Monday through Friday only started in December 2019.  Last Plaquenil eye exam normal on 07/23/2018.  Most recent CBC/CMP within normal limits on 05/16/2018.  Due for CBC/CMP today and will monitor every 3 months.  Standing orders are in place.  Vitamin D 50,000 units twice a week.  Last vitamin D level 18 on 05/09/2018.  Repeat vitamin D ordered for today.  History of Present Illness: Tina Stokes is a 53 y.o. female ***   Activities of Daily Living:  Patient reports morning stiffness for *** {minute/hour:19697}.   Patient {ACTIONS;DENIES/REPORTS:21021675::"Denies"} nocturnal pain.  Difficulty dressing/grooming: {ACTIONS;DENIES/REPORTS:21021675::"Denies"} Difficulty climbing stairs: {ACTIONS;DENIES/REPORTS:21021675::"Denies"} Difficulty getting out of chair: {ACTIONS;DENIES/REPORTS:21021675::"Denies"} Difficulty using hands for taps, buttons, cutlery, and/or writing: {ACTIONS;DENIES/REPORTS:21021675::"Denies"}  No Rheumatology ROS completed.   PMFS History:  Patient Active Problem List   Diagnosis Date Noted  . History of depression 05/16/2018  . Primary osteoarthritis of both knees 05/16/2018  . Primary osteoarthritis of both hands 05/16/2018  . Lichen planus 90/30/0923  . Raynaud's syndrome without gangrene 05/09/2018  . Other idiopathic scoliosis 04/11/2016  . Subserous leiomyoma of uterus 10/05/2015  . Mood disorder ([place]) 11/10/2014  . Acne   . Anemia 12/28/2009    Past Medical History:  Diagnosis Date  . Acne   . Arthritis   . DEPRESSION   . Fibroid uterus 07/31/2013   Right anterior fibroid 31 x 21 x 33 mm subserous, intramural 16 x 17 mm.   . Lichen planus    of the  scalp - sees dermatologist in [place]   . Raynaud's disease     Family History  Problem Relation Age of Onset  . Hypertension Mother   . Hyperlipidemia Mother   . Cancer Father        lung cancer  . Breast cancer Cousin        either 9 or 70  . Asthma Sister   . Hypertension Sister   . Healthy Brother   . Hashimoto's thyroiditis Son   . Vitiligo Son   . Autoimmune disease Son   . Hyperthyroidism Daughter   . Colon cancer Neg Hx    Past Surgical History:  Procedure Laterality Date  . COLONOSCOPY  2006   normal  . PELVIC LAPAROSCOPY    . TUBAL LIGATION  01/1999   Social History   Social History Narrative   Works as a Theme park manager.   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    Education: 2 years of college.    Lifestyle: no regular exercise; diet is healthy   Religion: husband is a Theme park manager at [employer]   Immunization History  Administered Date(s) Administered  . Influenza,inj,Quad PF,6+ Mos 03/09/2015, 02/12/2018  . Tdap 06/22/2012     Objective: Vital Signs: There were no vitals taken for this visit.   Physical Exam   Musculoskeletal Exam: ***  CDAI Exam: CDAI Score: Not documented Patient Global Assessment: Not documented; Provider Global Assessment: Not documented Swollen: Not documented; Tender: Not documented Joint Exam  Not documented   There is currently no information documented on the homunculus. Go to the Rheumatology activity and complete the homunculus joint exam.  Investigation: No additional findings.  Imaging: No results found.  Recent Labs: Lab Results  Component Value Date   WBC 4.2 05/16/2018   HGB 12.3 05/16/2018   PLT 296 05/16/2018   NA 142 05/16/2018   K 4.2 05/16/2018   CL 106 05/16/2018   CO2 29 05/16/2018   GLUCOSE 77 05/16/2018   BUN 10 05/16/2018   CREATININE 0.80 05/16/2018   BILITOT 0.3 05/16/2018   ALKPHOS 42 12/28/2009   AST 13 05/16/2018   ALT 10  05/16/2018   PROT 6.5 05/16/2018   ALBUMIN 3.8 12/28/2009   CALCIUM 9.5 05/16/2018   GFRAA 98 05/16/2018    Speciality Comments: No specialty comments available.  Procedures:  No procedures performed Allergies: Patient has no known allergies.   Assessment / Plan:     Visit Diagnoses: No diagnosis found.   Orders: No orders of the defined types were placed in this encounter.  No orders of the defined types were placed in this encounter.   Face-to-face time spent with patient was *** minutes. Greater than 50% of time was spent in counseling and coordination of care.  Follow-Up Instructions: No follow-ups on file.   Earnestine Mealing, CMA  Note - This record has been created using Editor, commissioning.  Chart creation errors have been sought, but may not always  have been located. Such creation errors do not reflect on  the standard of medical care.

## 2018-08-02 NOTE — Telephone Encounter (Addendum)
Last Visit: 05/16/18 Next visit: 08/15/18 Labs: 05/16/18 WNL PLQ Eye Exam: 07/23/18 WNL   Okay to refill per Dr. Estanislado Pandy

## 2018-08-15 ENCOUNTER — Ambulatory Visit: Payer: BLUE CROSS/BLUE SHIELD | Admitting: Rheumatology

## 2018-08-27 ENCOUNTER — Telehealth (INDEPENDENT_AMBULATORY_CARE_PROVIDER_SITE_OTHER): Payer: BLUE CROSS/BLUE SHIELD | Admitting: Rheumatology

## 2018-08-27 ENCOUNTER — Encounter: Payer: Self-pay | Admitting: Rheumatology

## 2018-08-27 DIAGNOSIS — L659 Nonscarring hair loss, unspecified: Secondary | ICD-10-CM

## 2018-08-27 DIAGNOSIS — Z8659 Personal history of other mental and behavioral disorders: Secondary | ICD-10-CM

## 2018-08-27 DIAGNOSIS — M170 Bilateral primary osteoarthritis of knee: Secondary | ICD-10-CM

## 2018-08-27 DIAGNOSIS — I7300 Raynaud's syndrome without gangrene: Secondary | ICD-10-CM | POA: Diagnosis not present

## 2018-08-27 DIAGNOSIS — Z79899 Other long term (current) drug therapy: Secondary | ICD-10-CM

## 2018-08-27 DIAGNOSIS — M19042 Primary osteoarthritis, left hand: Secondary | ICD-10-CM

## 2018-08-27 DIAGNOSIS — M19041 Primary osteoarthritis, right hand: Secondary | ICD-10-CM

## 2018-08-27 DIAGNOSIS — Z862 Personal history of diseases of the blood and blood-forming organs and certain disorders involving the immune mechanism: Secondary | ICD-10-CM

## 2018-08-27 DIAGNOSIS — L439 Lichen planus, unspecified: Secondary | ICD-10-CM

## 2018-08-27 NOTE — Progress Notes (Signed)
Virtual Visit via Telephone Note  I connected with Tina Stokes on 08/27/18 at  8:45 AM EDT by telephone and verified that I am speaking with the correct person using two identifiers.   I discussed the limitations, risks, security and privacy concerns of performing an evaluation and management service by telephone and the availability of in person appointments. I also discussed with the patient that there may be a patient responsible charge related to this service. The patient expressed understanding and agreed to proceed.  CC: Discuss discontinuing PLQ History of Present Illness: Patient is 53 year old female with a past medical history of Raynaud's and osteoarthritis.  She continues to take plaquenil 200 mg 1 tablet BID M-F.  She states she experiences lightheadedness when taking PLQ.  She has not noticed any benefit since starting to take PLQ.  She continues to have hair loss. She has very mild and intermittent symptoms of Raynaud's.  She denies any digital ulcerations. She is taking Aspirin 81 mg po daily. She would like to discontinue PLQ. She has occasional knee joint pain but denies any joint swelling. She uses voltaren gel topically PRN.    Review of Systems  Constitutional: Negative for fever and malaise/fatigue.  Eyes: Negative for photophobia, pain and redness.  Respiratory: Negative for cough, shortness of breath and wheezing.   Cardiovascular: Negative for chest pain and palpitations.  Gastrointestinal: Positive for constipation. Negative for blood in stool, diarrhea and nausea.  Musculoskeletal: Negative for back pain, joint pain and myalgias.  Skin: Negative for rash.  Neurological: Negative for headaches.  Psychiatric/Behavioral: Negative for depression. The patient is not nervous/anxious.       Observations/Objective: Physical Exam  Constitutional: She is oriented to person, place, and time.  Neurological: She is alert and oriented to person, place, and time.   Psychiatric: Memory, affect and judgment normal.  Denies difficulty getting up from a chair.   Denies difficulty going up and down steps. Denies any morning stiffness.   No digital ulcerations.    Assessment and Plan: Raynaud's syndrome without gangrene: She has mild intermittent symptoms of Raynaud's.  She has not had any digital ulcerations or signs of gangrene. She would like to discontinue PLQ at this time due to experiening dizziness as a side effect.  She was advised to follow up with PCP for further evaluation if the dizziness persists after discontinuing.  We discussed that if she has persistent symptoms of Raynaud's we can discuss adding on Norvasc and/or nitroglycerin paste.  She was advised to notify us if she develops any digital ulcerations or signs of gangrene.  High risk medication use: PLQ 200 mg BID M-F.  She would like to discontinue at this time-SE dizziness.  Primary osteoarthritis of both hands: She has no joint pain or joint swelling at this time.  Primary osteoarthritis of both knees: She has occasional knee joint pain.  She uses voltaren gel topically PRN for pain relief.   Hair loss: She continues to have hair loss and no new hair growth.  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 She has not noticed any improvement since starting on PLQ. Olux foam did not improve her symptoms.  She has had scalp steroid injections and prednisone tapers in the past which were helpful.  We discussed using Rogaine OTC.  MOA and advantages were discussed.  She tried using nutrafol without any improvement.    Follow Up Instructions: She will  follow up in 6 months. She will notify us if she develops worsening symptoms of Raynaud's.    I discussed the assessment and treatment plan with the patient. The patient was provided an opportunity to ask questions and all were answered. The patient agreed with the plan and demonstrated an  understanding of the instructions.   The patient was advised to call back or seek an in-person evaluation if the symptoms worsen or if the condition fails to improve as anticipated.  I provided 22 minutes of non-face-to-face time during this encounter.  Bo Merino, MD   Scribed by-   Ofilia Neas, PA-C

## 2018-12-03 ENCOUNTER — Encounter: Payer: Self-pay | Admitting: Women's Health

## 2019-02-13 NOTE — Progress Notes (Deleted)
Office Visit Note  Patient: Tina Stokes             Date of Birth: 1966-01-07           MRN: [id number]             PCP: Lucretia Kern, DO Referring: Lucretia Kern, DO Visit Date: 02/27/2019 Occupation: @GUAROCC @  Subjective:  No chief complaint on file.   History of Present Illness: Tina Stokes is a 53 y.o. female ***   Activities of Daily Living:  Patient reports morning stiffness for *** {minute/hour:19697}.   Patient {ACTIONS;DENIES/REPORTS:21021675::"Denies"} nocturnal pain.  Difficulty dressing/grooming: {ACTIONS;DENIES/REPORTS:21021675::"Denies"} Difficulty climbing stairs: {ACTIONS;DENIES/REPORTS:21021675::"Denies"} Difficulty getting out of chair: {ACTIONS;DENIES/REPORTS:21021675::"Denies"} Difficulty using hands for taps, buttons, cutlery, and/or writing: {ACTIONS;DENIES/REPORTS:21021675::"Denies"}  No Rheumatology ROS completed.   PMFS History:  Patient Active Problem List   Diagnosis Date Noted  . History of depression 05/16/2018  . Primary osteoarthritis of both knees 05/16/2018  . Primary osteoarthritis of both hands 05/16/2018  . Lichen planus [id number]  . Raynaud's syndrome without gangrene 05/09/2018  . Other idiopathic scoliosis 04/11/2016  . Subserous leiomyoma of uterus 10/05/2015  . Mood disorder ([place]) 11/10/2014  . Acne   . Anemia 12/28/2009    Past Medical History:  Diagnosis Date  . Acne   . Arthritis   . DEPRESSION   . Fibroid uterus 07/31/2013   Right anterior fibroid 31 x 21 x 33 mm subserous, intramural 16 x 17 mm.   . Lichen planus    of the scalp - sees dermatologist in [place]   . Raynaud's disease     Family History  Problem Relation Age of Onset  . Hypertension Mother   . Hyperlipidemia Mother   . Cancer Father        lung cancer  . Breast cancer Cousin        either 67 or 56  . Asthma Sister   . Hypertension Sister   . Healthy Brother   . Hashimoto's thyroiditis Son   . Vitiligo Son   .  Autoimmune disease Son   . Hyperthyroidism Daughter   . Colon cancer Neg Hx    Past Surgical History:  Procedure Laterality Date  . COLONOSCOPY  2006   normal  . PELVIC LAPAROSCOPY    . TUBAL LIGATION  01/1999   Social History   Social History Narrative   Works as a Theme park manager.   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    Education: 2 years of college.    Lifestyle: no regular exercise; diet is healthy   Religion: husband is a Theme park manager at [employer]   Immunization History  Administered Date(s) Administered  . Influenza,inj,Quad PF,6+ Mos 03/09/2015, 02/12/2018  . Tdap 06/22/2012     Objective: Vital Signs: There were no vitals taken for this visit.   Physical Exam   Musculoskeletal Exam: ***  CDAI Exam: CDAI Score: - Patient Global: -; Provider Global: - Swollen: -; Tender: - Joint Exam   No joint exam has been documented for this visit   There is currently no information documented on the homunculus. Go to the Rheumatology activity and complete the homunculus joint exam.  Investigation: No additional findings.  Imaging: No results found.  Recent Labs: Lab Results  Component Value Date   WBC 4.2 05/16/2018   HGB 12.3 05/16/2018   PLT 296 05/16/2018   NA 142 05/16/2018   K 4.2 05/16/2018  CL 106 05/16/2018   CO2 29 05/16/2018   GLUCOSE 77 05/16/2018   BUN 10 05/16/2018   CREATININE 0.80 05/16/2018   BILITOT 0.3 05/16/2018   ALKPHOS 42 12/28/2009   AST 13 05/16/2018   ALT 10 05/16/2018   PROT 6.5 05/16/2018   ALBUMIN 3.8 12/28/2009   CALCIUM 9.5 05/16/2018   GFRAA 98 05/16/2018    Speciality Comments: PLQ Eye Exam: 07/23/18 WNL by Dr Collier Salina Dunnfollow up in 6 months  Procedures:  No procedures performed Allergies: Patient has no known allergies.   Assessment / Plan:     Visit Diagnoses: No diagnosis found.  Orders: No orders of the defined types were placed in this encounter.  No orders of the defined  types were placed in this encounter.   Face-to-face time spent with patient was *** minutes. Greater than 50% of time was spent in counseling and coordination of care.  Follow-Up Instructions: No follow-ups on file.   Earnestine Mealing, CMA  Note - This record has been created using Editor, commissioning.  Chart creation errors have been sought, but may not always  have been located. Such creation errors do not reflect on  the standard of medical care.

## 2019-02-15 ENCOUNTER — Other Ambulatory Visit: Payer: Self-pay

## 2019-02-18 ENCOUNTER — Ambulatory Visit: Payer: BLUE CROSS/BLUE SHIELD | Admitting: Women's Health

## 2019-02-18 ENCOUNTER — Encounter: Payer: Self-pay | Admitting: Women's Health

## 2019-02-18 ENCOUNTER — Other Ambulatory Visit: Payer: Self-pay

## 2019-02-18 VITALS — BP 120/76 | Ht 61.0 in | Wt 152.0 lb

## 2019-02-18 DIAGNOSIS — N898 Other specified noninflammatory disorders of vagina: Secondary | ICD-10-CM | POA: Diagnosis not present

## 2019-02-18 DIAGNOSIS — Z01419 Encounter for gynecological examination (general) (routine) without abnormal findings: Secondary | ICD-10-CM

## 2019-02-18 DIAGNOSIS — Z13220 Encounter for screening for lipoid disorders: Secondary | ICD-10-CM

## 2019-02-18 LAB — WET PREP FOR TRICH, YEAST, CLUE

## 2019-02-18 NOTE — Progress Notes (Signed)
Tina Stokes January 09, 54 1967 [id number]    History:  53 y.o. MBF, G3 P2, Presents for annual exam with complaint of vaginal burning and dryness. No menstrual cycle for past 6 months. No vasomotor symptoms. History of BTL. History of Raynaud's, no longer on Plaquenil; Lichen planus on scalp, osteoarthritis, scoliosis and depression. No suicidal thoughts. 11/2018 normal mammogram. 2016, colonoscopy, severe diverticulosis of left colon.  Plantar fascitis, recent flare.    Past medical history, past surgical history, family history and social history were all reviewed and documented in the EPIC chart.  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 Daughter, Tina Stokes completing culinary school online due to [place], Tina Stokes doing well.   ROS:  A ROS was performed and pertinent positives and negatives are included.  Exam:  Vitals:   02/18/19 0830  BP: 120/76  Weight: 152 lb (68.9 kg)  Height: 5\' 1"  (1.549 m)   Body mass index is 28.72 kg/m.   General appearance:  Normal Thyroid:  Symmetrical, normal in size, without palpable masses or nodularity. Respiratory  Auscultation:  Clear without wheezing or rhonchi Cardiovascular  Auscultation:  Regular rate, without rubs, murmurs or gallops  Edema/varicosities:  Not grossly evident Abdominal  Soft,nontender, without masses, guarding or rebound.  Liver/spleen:  No organomegaly noted  Hernia:  None appreciated  Skin  Inspection:  Grossly normal   Breasts: Examined lying and sitting.     Right: Without masses, retractions, discharge or axillary adenopathy.     Left: Without masses, retractions, discharge or axillary adenopathy. Gentitourinary   Inguinal/mons:  Normal without inguinal adenopathy  External genitalia:  Normal  BUS/Urethra/Skene's glands:  Normal  Vagina:  Normal  Cervix:  Normal  Uterus:  Normal in size, shape and contour.  Midline and mobile  Adnexa/parametria:     Rt: Without masses or tenderness.   Lt: Without masses or  tenderness.  Anus and perineum: Normal    Assessment/Plan:  53 y.o.  for annual exam with complaints of burning and dryness.  Perimenopausal on no HRT, BTL Vaginal burning /Wet Prep-negative  Osteoarthritis  Lichen Planus on scalp managed by Dermatology   Plan: Perimenopause/menopause discussed. SBEs, annual screening mammogram, calcium rich foods, vitamin D 2000 daily encouraged. Encouraged daily exercise of 30-40 minutes daily. Decreasing caloric intake, monitoring carbs. Wet prep negative, vaginal burning related to dryness encouraged OTC [place]. Pap normal with HPV, 2017, new guidelines reviewed. Return to office fasting for Lipid, CBC and CMP.     [place], 8:42 AM 02/18/2019

## 2019-02-18 NOTE — Patient Instructions (Addendum)
Vit D3 2000 iu daily  Health Maintenance for Postmenopausal Women Menopause is a normal process in which your ability to get pregnant comes to an end. This process happens slowly over many months or years, usually between the ages of 48 and 55. Menopause is complete when you have missed your menstrual periods for 12 months. It is important to talk with your health care provider about some of the most common conditions that affect women after menopause (postmenopausal women). These include heart disease, cancer, and bone loss (osteoporosis). Adopting a healthy lifestyle and getting preventive care can help to promote your health and wellness. The actions you take can also lower your chances of developing some of these common conditions. What should I know about menopause? During menopause, you may get a number of symptoms, such as:  Hot flashes. These can be moderate or severe.  Night sweats.  Decrease in sex drive.  Mood swings.  Headaches.  Tiredness.  Irritability.  Memory problems.  Insomnia. Choosing to treat or not to treat these symptoms is a decision that you make with your health care provider. Do I need hormone replacement therapy?  Hormone replacement therapy is effective in treating symptoms that are caused by menopause, such as hot flashes and night sweats.  Hormone replacement carries certain risks, especially as you become older. If you are thinking about using estrogen or estrogen with progestin, discuss the benefits and risks with your health care provider. What is my risk for heart disease and stroke? The risk of heart disease, heart attack, and stroke increases as you age. One of the causes may be a change in the body's hormones during menopause. This can affect how your body uses dietary fats, triglycerides, and cholesterol. Heart attack and stroke are medical emergencies. There are many things that you can do to help prevent heart disease and stroke. Watch your  blood pressure  High blood pressure causes heart disease and increases the risk of stroke. This is more likely to develop in people who have high blood pressure readings, are of African descent, or are overweight.  Have your blood pressure checked: ? Every 3-5 years if you are 18-39 years of age. ? Every year if you are 40 years old or older. Eat a healthy diet   Eat a diet that includes plenty of vegetables, fruits, low-fat dairy products, and lean protein.  Do not eat a lot of foods that are high in solid fats, added sugars, or sodium. Get regular exercise Get regular exercise. This is one of the most important things you can do for your health. Most adults should:  Try to exercise for at least 150 minutes each week. The exercise should increase your heart rate and make you sweat (moderate-intensity exercise).  Try to do strengthening exercises at least twice each week. Do these in addition to the moderate-intensity exercise.  Spend less time sitting. Even light physical activity can be beneficial. Other tips  Work with your health care provider to achieve or maintain a healthy weight.  Do not use any products that contain nicotine or tobacco, such as cigarettes, e-cigarettes, and chewing tobacco. If you need help quitting, ask your health care provider.  Know your numbers. Ask your health care provider to check your cholesterol and your blood sugar (glucose). Continue to have your blood tested as directed by your health care provider. Do I need screening for cancer? Depending on your health history and family history, you may need to have cancer screening at   different stages of your life. This may include screening for:  Breast cancer.  Cervical cancer.  Lung cancer.  Colorectal cancer. What is my risk for osteoporosis? After menopause, you may be at increased risk for osteoporosis. Osteoporosis is a condition in which bone destruction happens more quickly than new bone  creation. To help prevent osteoporosis or the bone fractures that can happen because of osteoporosis, you may take the following actions:  If you are 19-50 years old, get at least 1,000 mg of calcium and at least 600 mg of vitamin D per day.  If you are older than age 50 but younger than age 70, get at least 1,200 mg of calcium and at least 600 mg of vitamin D per day.  If you are older than age 70, get at least 1,200 mg of calcium and at least 800 mg of vitamin D per day. Smoking and drinking excessive alcohol increase the risk of osteoporosis. Eat foods that are rich in calcium and vitamin D, and do weight-bearing exercises several times each week as directed by your health care provider. How does menopause affect my mental health? Depression may occur at any age, but it is more common as you become older. Common symptoms of depression include:  Low or sad mood.  Changes in sleep patterns.  Changes in appetite or eating patterns.  Feeling an overall lack of motivation or enjoyment of activities that you previously enjoyed.  Frequent crying spells. Talk with your health care provider if you think that you are experiencing depression. General instructions See your health care provider for regular wellness exams and vaccines. This may include:  Scheduling regular health, dental, and eye exams.  Getting and maintaining your vaccines. These include: ? Influenza vaccine. Get this vaccine each year before the flu season begins. ? Pneumonia vaccine. ? Shingles vaccine. ? Tetanus, diphtheria, and pertussis (Tdap) booster vaccine. Your health care provider may also recommend other immunizations. Tell your health care provider if you have ever been abused or do not feel safe at home. Summary  Menopause is a normal process in which your ability to get pregnant comes to an end.  This condition causes hot flashes, night sweats, decreased interest in sex, mood swings, headaches, or lack of  sleep.  Treatment for this condition may include hormone replacement therapy.  Take actions to keep yourself healthy, including exercising regularly, eating a healthy diet, watching your weight, and checking your blood pressure and blood sugar levels.  Get screened for cancer and depression. Make sure that you are up to date with all your vaccines. This information is not intended to replace advice given to you by your health care provider. Make sure you discuss any questions you have with your health care provider. Document Released: 07/08/2005 Document Revised: 05/09/2018 Document Reviewed: 05/09/2018 Elsevier Patient Education  2020 [employer]  

## 2019-02-19 ENCOUNTER — Other Ambulatory Visit: Payer: BLUE CROSS/BLUE SHIELD

## 2019-02-19 DIAGNOSIS — Z13220 Encounter for screening for lipoid disorders: Secondary | ICD-10-CM

## 2019-02-19 DIAGNOSIS — Z01419 Encounter for gynecological examination (general) (routine) without abnormal findings: Secondary | ICD-10-CM

## 2019-02-19 LAB — LIPID PANEL
Cholesterol: 201 mg/dL — ABNORMAL HIGH (ref <200)
HDL: 97 mg/dL (ref >=50)
LDL Cholesterol (Calc): 88 mg/dL (calc)
Non-HDL Cholesterol (Calc): 104 mg/dL (calc) (ref <130)
Total CHOL/HDL Ratio: 2.1 (calc) (ref <5.0)
Triglycerides: 69 mg/dL (ref <150)

## 2019-02-19 LAB — COMPREHENSIVE METABOLIC PANEL
AG Ratio: 1.6 (calc) (ref 1.0–2.5)
ALT: 8 U/L (ref 6–29)
AST: 13 U/L (ref 10–35)
Albumin: 3.9 g/dL (ref 3.6–5.1)
Alkaline phosphatase (APISO): 50 U/L (ref 37–153)
BUN: 10 mg/dL (ref 7–25)
CO2: 26 mmol/L (ref 20–32)
Calcium: 9.4 mg/dL (ref 8.6–10.4)
Chloride: 108 mmol/L (ref 98–110)
Creat: 0.76 mg/dL (ref 0.50–1.05)
Globulin: 2.4 g/dL (calc) (ref 1.9–3.7)
Glucose, Bld: 90 mg/dL (ref 65–99)
Potassium: 4.2 mmol/L (ref 3.5–5.3)
Sodium: 142 mmol/L (ref 135–146)
Total Bilirubin: 0.3 mg/dL (ref 0.2–1.2)
Total Protein: 6.3 g/dL (ref 6.1–8.1)

## 2019-02-19 LAB — CBC WITH DIFFERENTIAL/PLATELET
Absolute Monocytes: 399 cells/uL (ref 200–950)
Basophils Absolute: 21 cells/uL (ref 0–200)
Basophils Relative: 0.5 %
Eosinophils Absolute: 130 cells/uL (ref 15–500)
Eosinophils Relative: 3.1 %
HCT: 36.7 % (ref 35.0–45.0)
Hemoglobin: 12.1 g/dL (ref 11.7–15.5)
Lymphs Abs: 2066 cells/uL (ref 850–3900)
MCH: 29.3 pg (ref 27.0–33.0)
MCHC: 33 g/dL (ref 32.0–36.0)
MCV: 88.9 fL (ref 80.0–100.0)
MPV: 10.7 fL (ref 7.5–12.5)
Monocytes Relative: 9.5 %
Neutro Abs: 1583 cells/uL (ref 1500–7800)
Neutrophils Relative %: 37.7 %
Platelets: 293 10*3/uL (ref 140–400)
RBC: 4.13 10*6/uL (ref 3.80–5.10)
RDW: 13.5 % (ref 11.0–15.0)
Total Lymphocyte: 49.2 %
WBC: 4.2 10*3/uL (ref 3.8–10.8)

## 2019-02-20 ENCOUNTER — Encounter: Payer: Self-pay | Admitting: Gynecology

## 2019-02-21 ENCOUNTER — Telehealth: Payer: Self-pay | Admitting: *Deleted

## 2019-02-21 MED ORDER — FLUCONAZOLE 150 MG PO TABS: 150.0000 mg | ORAL_TABLET | Freq: Once | ORAL | 0 refills | Status: AC

## 2019-02-21 NOTE — Telephone Encounter (Signed)
Patient informed, Rx sent.  

## 2019-02-21 NOTE — Telephone Encounter (Signed)
Ok, she was just in for annual, diflucan 150mg  1 dose

## 2019-02-21 NOTE — Telephone Encounter (Signed)
Patient called c/o yeast infection symptoms asked if diflucan tablet can be sent to pharmacy? Please advise

## 2019-02-27 ENCOUNTER — Ambulatory Visit: Payer: Self-pay | Admitting: Rheumatology

## 2019-06-12 ENCOUNTER — Other Ambulatory Visit: Payer: BLUE CROSS/BLUE SHIELD

## 2019-10-02 ENCOUNTER — Ambulatory Visit: Payer: Self-pay | Admitting: Family Medicine

## 2019-10-16 ENCOUNTER — Other Ambulatory Visit: Payer: Self-pay

## 2019-10-17 ENCOUNTER — Ambulatory Visit (INDEPENDENT_AMBULATORY_CARE_PROVIDER_SITE_OTHER): Payer: 59 | Admitting: Family Medicine

## 2019-10-17 ENCOUNTER — Encounter: Payer: Self-pay | Admitting: Family Medicine

## 2019-10-17 VITALS — BP 118/76 | HR 89 | Temp 97.4°F | Wt 145.4 lb

## 2019-10-17 DIAGNOSIS — I7300 Raynaud's syndrome without gangrene: Secondary | ICD-10-CM

## 2019-10-17 DIAGNOSIS — B373 Candidiasis of vulva and vagina: Secondary | ICD-10-CM | POA: Diagnosis not present

## 2019-10-17 DIAGNOSIS — B3731 Acute candidiasis of vulva and vagina: Secondary | ICD-10-CM

## 2019-10-17 DIAGNOSIS — Z0000 Encounter for general adult medical examination without abnormal findings: Secondary | ICD-10-CM

## 2019-10-17 MED ORDER — FLUCONAZOLE 150 MG PO TABS: ORAL_TABLET | ORAL | 2 refills | Status: DC

## 2019-10-17 NOTE — Progress Notes (Signed)
Tina Stokes is a 54 y.o. female  Chief Complaint  Patient presents with  . Transitions Of Care    Patient is here today to transfer care from Dr. Maudie Mercury to Dr. Bryan Lemma. Sees Dr. Annamaria Boots at [facility]. Last Mammogram at [facility] last September WNL.    HPI: Tina Stokes is a 54 y.o. female seen today for [facility] appt, previous PCP Dr. Maudie Mercury, and CPE. She follows with GYN [name], NP at [facility]. Last labs (lipid panel, CMP, CBC) in 01/2019.  She follows with rheum Dr. Estanislado Pandy for Raynaud's syndrome.  Last PAP: 01/2016 - follows with GYN [name], NP Last mammo: 11/2018 Last colonoscopy: 12/2014 with [facility] Dr. Carlean Purl - due in 2026  Exercise: walks 2x/wk with her sister, she has lost 5lbs in 1 mo  Past Medical History:  Diagnosis Date  . Acne   . Arthritis   . DEPRESSION   . Fibroid uterus 07/31/2013   Right anterior fibroid 31 x 21 x 33 mm subserous, intramural 16 x 17 mm.   . Lichen planus    of the scalp - sees dermatologist in [place]   . Raynaud's disease     Past Surgical History:  Procedure Laterality Date  . COLONOSCOPY  2006   normal  . PELVIC LAPAROSCOPY    . TUBAL LIGATION  01/1999    Social History   Socioeconomic History  . Marital status: Married    Spouse name: Not on file  . Number of children: Not on file  . Years of education: Not on file  . Highest education level: Not on file  Occupational History  . Not on file  Tobacco Use  . Smoking status: Never Smoker  . Smokeless tobacco: Never Used  . Tobacco comment: Married, lives with spouse and 2 kids  Substance and Sexual Activity  . Alcohol use: No    Alcohol/week: 0.0 standard drinks  . Drug use: No  . Sexual activity: Yes    Partners: Male    Birth control/protection: Surgical    Comment: intercourse age 28, sexual partners less than 5  Other Topics Concern  . Not on file  Social History Narrative   Works as a Theme park manager.   Married, lives with spouse  and 2 kids in a one story home.    Education: 2 years of college.    Lifestyle: no regular exercise; diet is healthy   Religion: husband is a Theme park manager at [employer]   Social Determinants of Radio broadcast assistant Strain:   . Difficulty of Paying Living Expenses:   Food Insecurity:   . Worried About Charity fundraiser in the Last Year:   . Arboriculturist in the Last Year:   Transportation Needs:   . Film/video editor (Medical):   Marland Kitchen Lack of Transportation (Non-Medical):   Physical Activity:   . Days of Exercise per Week:   . Minutes of Exercise per Session:   Stress:   . Feeling of Stress :   Social Connections:   . Frequency of Communication with Friends and Family:   . Frequency of Social Gatherings with Friends and Family:   . Attends Religious Services:   . Active Member of Clubs or Organizations:   . Attends Archivist Meetings:   Marland Kitchen Marital Status:   Intimate Partner Violence:   . Fear of Current or Ex-Partner:   . Emotionally Abused:   Marland Kitchen Physically Abused:   .  Sexually Abused:     Family History  Problem Relation Age of Onset  . Hypertension Mother   . Hyperlipidemia Mother   . Cancer Father        lung cancer  . Breast cancer Cousin        either 56 or 47  . Asthma Sister   . Hypertension Sister   . Healthy Brother   . Hashimoto's thyroiditis Son   . Vitiligo Son   . Autoimmune disease Son   . Hyperthyroidism Daughter   . Colon cancer Neg Hx      Immunization History  Administered Date(s) Administered  . Influenza,inj,Quad PF,6+ Mos 03/09/2015, 02/12/2018  . Tdap 06/22/2012    Outpatient Encounter Medications as of 10/17/2019  Medication Sig  . diclofenac sodium (VOLTAREN) 1 % GEL 3 grams to 3 large joints up to 3 times daily  . fluconazole (DIFLUCAN) 150 MG tablet Take 1 tab po x 1 dose, may repeat x 1 tab in 72hrs  . [DISCONTINUED] Cetirizine HCl (ZYRTEC PO) Take by mouth daily.   No facility-administered  encounter medications on file as of 10/17/2019.     ROS: Gen: no fever, chills  Skin: no rash, itching ENT: no ear pain, ear drainage, nasal congestion, rhinorrhea, sinus pressure, sore throat Eyes: no blurry vision, double vision Resp: no cough, wheeze,SOB CV: no CP, palpitations, LE edema,  GI: no heartburn, n/v/d/c, abd pain GU: no dysuria, urgency, frequency, hematuria  MSK: + joint pain, myalgias, back pain Neuro: no dizziness, headache, weakness Psych: no depression, anxiety, insomnia   No Known Allergies  BP 118/76 (BP Location: Right Arm, Patient Position: Sitting, Cuff Size: Normal)   Pulse 89   Temp (!) 97.4 F (36.3 C) (Temporal)   Wt 145 lb 6.4 oz (66 kg)   SpO2 99%   BMI 27.47 kg/m   Physical Exam  Constitutional: She is oriented to person, place, and time. She appears well-developed and well-nourished. No distress.  HENT:  Head: Normocephalic and atraumatic.  Right Ear: Tympanic membrane and ear canal normal.  Left Ear: Tympanic membrane and ear canal normal.  Nose: Nose normal.  Mouth/Throat: Oropharynx is clear and moist and mucous membranes are normal.  Eyes: Pupils are equal, round, and reactive to light. Conjunctivae are normal.  Neck: No thyromegaly present.  Cardiovascular: Normal rate, regular rhythm, normal heart sounds and intact distal pulses.  No murmur heard. Pulmonary/Chest: Effort normal and breath sounds normal. No respiratory distress. She has no wheezes. She has no rhonchi.  Abdominal: Soft. Bowel sounds are normal. She exhibits no distension and no mass. There is no abdominal tenderness.  Musculoskeletal:        General: No edema.     Cervical back: Neck supple.  Lymphadenopathy:    She has no cervical adenopathy.  Neurological: She is alert and oriented to person, place, and time. She exhibits normal muscle tone. Coordination normal.  Skin: Skin is warm and dry.  Psychiatric: She has a normal mood and affect. Her behavior is normal.       A/P:  1. Raynaud's syndrome without gangrene - stable - follows with rheum  2. Yeast infection of the vagina - pt with recurrent yeast infections. Asymptomatic at this time but states Dr. Maudie Mercury was ok to give her Rx for diflucan to have on file at pharm in case needed and then if no improvement with treatment, pt would see Dr. Maudie Mercury or GYN Rx: - fluconazole (DIFLUCAN) 150 MG tablet; Take 1 tab  po x 1 dose, may repeat x 1 tab in 72hrs  Dispense: 2 tablet; Refill: 2  3. Annual physical exam - discussed importance of regular CV exercise, healthy diet, adequate sleep - pt is walking 2x/wk and has lost 5lbs in 1 mo - UTD on immunizations - labs done in 01/2019 and pt does not want/need to repeat today - PAP, mammo, colo UTD - next CPE in 1 year    This visit occurred during the SARS-CoV-2 public health emergency.  Safety protocols were in place, including screening questions prior to the visit, additional usage of staff PPE, and extensive cleaning of exam room while observing appropriate contact time as indicated for disinfecting solutions.

## 2019-12-09 ENCOUNTER — Encounter: Payer: Self-pay | Admitting: Nurse Practitioner

## 2020-02-19 ENCOUNTER — Other Ambulatory Visit: Payer: Self-pay

## 2020-02-19 ENCOUNTER — Encounter: Payer: Self-pay | Admitting: Nurse Practitioner

## 2020-02-19 ENCOUNTER — Ambulatory Visit (INDEPENDENT_AMBULATORY_CARE_PROVIDER_SITE_OTHER): Payer: 59 | Admitting: Nurse Practitioner

## 2020-02-19 VITALS — BP 110/78 | Ht 61.0 in | Wt 142.0 lb

## 2020-02-19 DIAGNOSIS — Z780 Asymptomatic menopausal state: Secondary | ICD-10-CM | POA: Diagnosis not present

## 2020-02-19 DIAGNOSIS — R5383 Other fatigue: Secondary | ICD-10-CM

## 2020-02-19 DIAGNOSIS — Z8639 Personal history of other endocrine, nutritional and metabolic disease: Secondary | ICD-10-CM | POA: Diagnosis not present

## 2020-02-19 DIAGNOSIS — Z01419 Encounter for gynecological examination (general) (routine) without abnormal findings: Secondary | ICD-10-CM

## 2020-02-19 DIAGNOSIS — Z862 Personal history of diseases of the blood and blood-forming organs and certain disorders involving the immune mechanism: Secondary | ICD-10-CM

## 2020-02-19 MED ORDER — VITAMIN D (ERGOCALCIFEROL) 1.25 MG (50000 UNIT) PO CAPS: 50000.0000 [IU] | ORAL_CAPSULE | ORAL | 0 refills | Status: DC

## 2020-02-19 NOTE — Patient Instructions (Signed)
Health Maintenance, Female Adopting a healthy lifestyle and getting preventive care are important in promoting health and wellness. Ask your health care provider about:  The right schedule for you to have regular tests and exams.  Things you can do on your own to prevent diseases and keep yourself healthy. What should I know about diet, weight, and exercise? Eat a healthy diet   Eat a diet that includes plenty of vegetables, fruits, low-fat dairy products, and lean protein.  Do not eat a lot of foods that are high in solid fats, added sugars, or sodium. Maintain a healthy weight Body mass index (BMI) is used to identify weight problems. It estimates body fat based on height and weight. Your health care provider can help determine your BMI and help you achieve or maintain a healthy weight. Get regular exercise Get regular exercise. This is one of the most important things you can do for your health. Most adults should:  Exercise for at least 150 minutes each week. The exercise should increase your heart rate and make you sweat (moderate-intensity exercise).  Do strengthening exercises at least twice a week. This is in addition to the moderate-intensity exercise.  Spend less time sitting. Even light physical activity can be beneficial. Watch cholesterol and blood lipids Have your blood tested for lipids and cholesterol at 55 years of age, then have this test every 5 years. Have your cholesterol levels checked more often if:  Your lipid or cholesterol levels are high.  You are older than 55 years of age.  You are at high risk for heart disease. What should I know about cancer screening? Depending on your health history and family history, you may need to have cancer screening at various ages. This may include screening for:  Breast cancer.  Cervical cancer.  Colorectal cancer.  Skin cancer.  Lung cancer. What should I know about heart disease, diabetes, and high blood  pressure? Blood pressure and heart disease  High blood pressure causes heart disease and increases the risk of stroke. This is more likely to develop in people who have high blood pressure readings, are of African descent, or are overweight.  Have your blood pressure checked: ? Every 3-5 years if you are 18-39 years of age. ? Every year if you are 40 years old or older. Diabetes Have regular diabetes screenings. This checks your fasting blood sugar level. Have the screening done:  Once every three years after age 40 if you are at a normal weight and have a low risk for diabetes.  More often and at a younger age if you are overweight or have a high risk for diabetes. What should I know about preventing infection? Hepatitis B If you have a higher risk for hepatitis B, you should be screened for this virus. Talk with your health care provider to find out if you are at risk for hepatitis B infection. Hepatitis C Testing is recommended for:  Everyone born from 1945 through 1965.  Anyone with known risk factors for hepatitis C. Sexually transmitted infections (STIs)  Get screened for STIs, including gonorrhea and chlamydia, if: ? You are sexually active and are younger than 55 years of age. ? You are older than 55 years of age and your health care provider tells you that you are at risk for this type of infection. ? Your sexual activity has changed since you were last screened, and you are at increased risk for chlamydia or gonorrhea. Ask your health care provider if   you are at risk.  Ask your health care provider about whether you are at high risk for HIV. Your health care provider may recommend a prescription medicine to help prevent HIV infection. If you choose to take medicine to prevent HIV, you should first get tested for HIV. You should then be tested every 3 months for as long as you are taking the medicine. Pregnancy  If you are about to stop having your period (premenopausal) and  you may become pregnant, seek counseling before you get pregnant.  Take 400 to 800 micrograms (mcg) of folic acid every day if you become pregnant.  Ask for birth control (contraception) if you want to prevent pregnancy. Osteoporosis and menopause Osteoporosis is a disease in which the bones lose minerals and strength with aging. This can result in bone fractures. If you are 65 years old or older, or if you are at risk for osteoporosis and fractures, ask your health care provider if you should:  Be screened for bone loss.  Take a calcium or vitamin D supplement to lower your risk of fractures.  Be given hormone replacement therapy (HRT) to treat symptoms of menopause. Follow these instructions at home: Lifestyle  Do not use any products that contain nicotine or tobacco, such as cigarettes, e-cigarettes, and chewing tobacco. If you need help quitting, ask your health care provider.  Do not use street drugs.  Do not share needles.  Ask your health care provider for help if you need support or information about quitting drugs. Alcohol use  Do not drink alcohol if: ? Your health care provider tells you not to drink. ? You are pregnant, may be pregnant, or are planning to become pregnant.  If you drink alcohol: ? Limit how much you use to 0-1 drink a day. ? Limit intake if you are breastfeeding.  Be aware of how much alcohol is in your drink. In the [place] one drink equals one 12 oz bottle of beer (355 mL), one 5 oz glass of wine (148 mL), or one 1 oz glass of hard liquor (44 mL). General instructions  Schedule regular health, dental, and eye exams.  Stay current with your vaccines.  Tell your health care provider if: ? You often feel depressed. ? You have ever been abused or do not feel safe at home. Summary  Adopting a healthy lifestyle and getting preventive care are important in promoting health and wellness.  Follow your health care provider's instructions about healthy  diet, exercising, and getting tested or screened for diseases.  Follow your health care provider's instructions on monitoring your cholesterol and blood pressure. This information is not intended to replace advice given to you by your health care provider. Make sure you discuss any questions you have with your health care provider. Document Revised: 05/09/2018 Document Reviewed: 05/09/2018 Elsevier Patient Education  2020 Elsevier Inc.  

## 2020-02-19 NOTE — Progress Notes (Signed)
   Tina Stokes 12/24/1965 [id number]   History:  54 y.o. [id number] presents for annual exam. She complains of fatigue that occurs most days, worse in the afternoon. BTL. Postmenopausal - no HRT, no bleeding. Has occasional hot flashes and vaginal dryness/pain with intercourse. She uses OCT lubrication with some relief. Normal pap and mammogram history. History of anemia and vitamin D deficiency. Most recent Vitamin D one year ago.    Gynecologic History Contraception: post menopausal status Last Pap: 02/13/2016 . Results were: normal Last mammogram: 12/09/2019. Results were: normal Last colonoscopy: 01/13/2015. Results were: severe diverticulosis, 10 year follow up recommended   Past medical history, past surgical history, family history and social history were all reviewed and documented in the EPIC chart.  ROS:  A ROS was performed and pertinent positives and negatives are included.  Exam:  Vitals:   02/19/20 0823  BP: 110/78  Weight: 142 lb (64.4 kg)  Height: 5\' 1"  (1.549 m)   Body mass index is 26.83 kg/m.  General appearance:  Normal Thyroid:  Symmetrical, normal in size, without palpable masses or nodularity. Respiratory  Auscultation:  Clear without wheezing or rhonchi Cardiovascular  Auscultation:  Regular rate, without rubs, murmurs or gallops  Edema/varicosities:  Not grossly evident Abdominal  Soft,nontender, without masses, guarding or rebound.  Liver/spleen:  No organomegaly noted  Hernia:  None appreciated  Skin  Inspection:  Grossly normal   Breasts: Examined lying and sitting.   Right: Without masses, retractions, discharge or axillary adenopathy.   Left: Without masses, retractions, discharge or axillary adenopathy. Gentitourinary   Inguinal/mons:  Normal without inguinal adenopathy  External genitalia:  Normal  BUS/Urethra/Skene's glands:  Normal  Vagina:  Normal  Cervix:  Normal  Uterus:  Normal in size, shape and contour.  Midline and  mobile  Adnexa/parametria:     Rt: Without masses or tenderness.   Lt: Without masses or tenderness.  Anus and perineum: Normal  Digital rectal exam: Normal sphincter tone without palpated masses or tenderness  Assessment/Plan:  54 y.o. [id number] for annual exam.   Well female exam with routine gynecological exam - Plan: CBC with Differential/Platelet, Comprehensive metabolic panel, Lipid panel. Education provided on SBEs, importance of preventative screenings, current guidelines, high calcium diet, regular exercise, and multivitamin daily.   Screening for cervical cancer - normal pap history. Last pap 01/2016. We will repeat at 5 year interval.   Screening for breast cancer - normal mammogram history. Normal breast exam today. Last mammogram 11/2019.   Fatigue, unspecified type - Plan: CBC with Differential/Platelet, Comprehensive metabolic panel, TSH, Vitamin D.  Postmenopausal - no HRT, no bleeding. Occasional hot flashes. Vaginal dryness/painful intercourse, using OTC lubricants with some relief.   History of vitamin D deficiency - Plan: VITAMIN D 25 Hydroxy (Vit-D Deficiency, Fractures). Vitamin D one year ago was 63. Does not take a daily supplement and does not spend much time outside due to scalp condition.   History of anemia - Plan: CBC with Differential/Platelet. Has been on iron supplements in the past.   Follow up in 1 year for annual      [place], 8:36 AM 02/19/2020

## 2020-02-20 ENCOUNTER — Other Ambulatory Visit: Payer: Self-pay | Admitting: Nurse Practitioner

## 2020-02-20 DIAGNOSIS — E559 Vitamin D deficiency, unspecified: Secondary | ICD-10-CM

## 2020-02-20 LAB — CBC WITH DIFFERENTIAL/PLATELET
Absolute Monocytes: 289 cells/uL (ref 200–950)
Basophils Absolute: 19 cells/uL (ref 0–200)
Basophils Relative: 0.5 %
Eosinophils Absolute: 130 cells/uL (ref 15–500)
Eosinophils Relative: 3.5 %
HCT: 37.9 % (ref 35.0–45.0)
Hemoglobin: 12.9 g/dL (ref 11.7–15.5)
Lymphs Abs: 1647 cells/uL (ref 850–3900)
MCH: 30.2 pg (ref 27.0–33.0)
MCHC: 34 g/dL (ref 32.0–36.0)
MCV: 88.8 fL (ref 80.0–100.0)
MPV: 10.2 fL (ref 7.5–12.5)
Monocytes Relative: 7.8 %
Neutro Abs: 1617 cells/uL (ref 1500–7800)
Neutrophils Relative %: 43.7 %
Platelets: 311 10*3/uL (ref 140–400)
RBC: 4.27 10*6/uL (ref 3.80–5.10)
RDW: 13.1 % (ref 11.0–15.0)
Total Lymphocyte: 44.5 %
WBC: 3.7 10*3/uL — ABNORMAL LOW (ref 3.8–10.8)

## 2020-02-20 LAB — LIPID PANEL
Cholesterol: 204 mg/dL — ABNORMAL HIGH (ref <200)
HDL: 105 mg/dL (ref >=50)
LDL Cholesterol (Calc): 86 mg/dL (calc)
Non-HDL Cholesterol (Calc): 99 mg/dL (calc) (ref <130)
Total CHOL/HDL Ratio: 1.9 (calc) (ref <5.0)
Triglycerides: 58 mg/dL (ref <150)

## 2020-02-20 LAB — COMPREHENSIVE METABOLIC PANEL
AG Ratio: 1.7 (calc) (ref 1.0–2.5)
ALT: 9 U/L (ref 6–29)
AST: 12 U/L (ref 10–35)
Albumin: 4.2 g/dL (ref 3.6–5.1)
Alkaline phosphatase (APISO): 50 U/L (ref 37–153)
BUN: 10 mg/dL (ref 7–25)
CO2: 30 mmol/L (ref 20–32)
Calcium: 9.7 mg/dL (ref 8.6–10.4)
Chloride: 106 mmol/L (ref 98–110)
Creat: 0.72 mg/dL (ref 0.50–1.05)
Globulin: 2.5 g/dL (calc) (ref 1.9–3.7)
Glucose, Bld: 79 mg/dL (ref 65–99)
Potassium: 4.1 mmol/L (ref 3.5–5.3)
Sodium: 142 mmol/L (ref 135–146)
Total Bilirubin: 0.4 mg/dL (ref 0.2–1.2)
Total Protein: 6.7 g/dL (ref 6.1–8.1)

## 2020-02-20 LAB — VITAMIN B12: Vitamin B-12: 554 pg/mL (ref 200–1100)

## 2020-02-20 LAB — TSH: TSH: 2.07 mIU/L

## 2020-02-20 LAB — VITAMIN D 25 HYDROXY (VIT D DEFICIENCY, FRACTURES): Vit D, 25-Hydroxy: 19 ng/mL — ABNORMAL LOW (ref 30–100)

## 2020-02-20 MED ORDER — VITAMIN D (ERGOCALCIFEROL) 1.25 MG (50000 UNIT) PO CAPS: 50000.0000 [IU] | ORAL_CAPSULE | ORAL | 0 refills | Status: AC

## 2020-04-16 ENCOUNTER — Other Ambulatory Visit: Payer: Self-pay | Admitting: Nurse Practitioner

## 2020-04-16 DIAGNOSIS — E559 Vitamin D deficiency, unspecified: Secondary | ICD-10-CM

## 2020-09-02 ENCOUNTER — Other Ambulatory Visit: Payer: Self-pay | Admitting: Nurse Practitioner

## 2020-09-02 ENCOUNTER — Telehealth: Payer: Self-pay | Admitting: *Deleted

## 2020-09-02 DIAGNOSIS — F419 Anxiety disorder, unspecified: Secondary | ICD-10-CM

## 2020-09-02 MED ORDER — ALPRAZOLAM 0.25 MG PO TABS: 0.2500 mg | ORAL_TABLET | ORAL | 0 refills | Status: DC | PRN

## 2020-09-02 NOTE — Telephone Encounter (Signed)
Patient called requesting a Rx for Xanax, reports Izora Gala prescribed her this Rx to take as needed to help with flying. Patient will be leaving in the next couple of weeks for this flight.  Patient asked if you would be willing to prescribe Rx? If approved it can be sent to pharmacy listed in chart.

## 2020-09-02 NOTE — Telephone Encounter (Signed)
Left message on patient voice mail.. Rx has been sent

## 2020-09-02 NOTE — Telephone Encounter (Signed)
I have sent in Xanax 0.25 mg #10 for anxiety related to flying. Thank you.

## 2020-10-20 ENCOUNTER — Other Ambulatory Visit: Payer: Self-pay

## 2020-10-20 ENCOUNTER — Ambulatory Visit
Admission: EM | Admit: 2020-10-20 | Discharge: 2020-10-20 | Disposition: A | Discharge status: Home / Self Care | Payer: 59 | Attending: Emergency Medicine | Admitting: Emergency Medicine

## 2020-10-20 DIAGNOSIS — R059 Cough, unspecified: Secondary | ICD-10-CM | POA: Diagnosis not present

## 2020-10-20 DIAGNOSIS — Z20822 Contact with and (suspected) exposure to covid-19: Secondary | ICD-10-CM | POA: Diagnosis not present

## 2020-10-20 MED ORDER — BENZONATATE 100 MG PO CAPS: 100.0000 mg | ORAL_CAPSULE | Freq: Three times a day (TID) | ORAL | 0 refills | Status: AC | PRN

## 2020-10-20 MED ORDER — BENZONATATE 100 MG PO CAPS: 100.0000 mg | ORAL_CAPSULE | Freq: Three times a day (TID) | ORAL | 0 refills | Status: DC | PRN

## 2020-10-20 NOTE — Discharge Instructions (Signed)
Take two Tessalon Perles with your Robitussin DM for cough.

## 2020-10-20 NOTE — ED Triage Notes (Signed)
Pt c/o cough, congestion, chills, body aches, headaches, and fatigue since Saturday. States taking TheraFlu and tylenol with relief.

## 2020-10-20 NOTE — ED Provider Notes (Signed)
[place]  ____________________________________________  Time seen: Approximately 7:59 PM  I have reviewed the triage vital signs and the nursing notes.   HISTORY  Chief Complaint Cough   Historian Patient     HPI Tina Stokes is a 55 y.o. female presents to the urgent care with cough, nasal congestion, chills, body aches and headache that started on Saturday.  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  No chest pain, chest tightness or abdominal pain.  No diarrhea or vomiting.  No sick contacts in the home with similar symptoms.  Patient is primarily concerned about cough.   Past Medical History:  Diagnosis Date  . Acne   . Arthritis   . DEPRESSION   . Fibroid uterus 07/31/2013   Right anterior fibroid 31 x 21 x 33 mm subserous, intramural 16 x 17 mm.   . Lichen planus    of the scalp - sees dermatologist in [place]   . Raynaud's disease      Immunizations up to date:  Yes.     Past Medical History:  Diagnosis Date  . Acne   . Arthritis   . DEPRESSION   . Fibroid uterus 07/31/2013   Right anterior fibroid 31 x 21 x 33 mm subserous, intramural 16 x 17 mm.   . Lichen planus    of the scalp - sees dermatologist in [place]   . Raynaud's disease     Patient Active Problem List   Diagnosis Date Noted  . History of depression 05/16/2018  . Primary osteoarthritis of both knees 05/16/2018  . Primary osteoarthritis of both hands 05/16/2018  . Lichen planus 42/70/6237  . Raynaud's syndrome without gangrene 05/09/2018  . Other idiopathic scoliosis 04/11/2016  . Subserous leiomyoma of uterus 10/05/2015  . Mood disorder ([place]) 11/10/2014  . Acne   . Anemia 12/28/2009    Past Surgical History:  Procedure Laterality Date  . COLONOSCOPY  2006   normal  . PELVIC LAPAROSCOPY    . TUBAL LIGATION  01/1999    Prior to Admission medications   Medication Sig Start Date End Date Taking?  Authorizing Provider  ALPRAZolam (XANAX) 0.25 MG tablet Take 1 tablet (0.25 mg total) by mouth as needed for anxiety. For flying only 09/02/20   [name], NP  benzonatate (TESSALON PERLES) 100 MG capsule Take 1 capsule (100 mg total) by mouth 3 (three) times daily as needed for up to 7 days for cough. 10/20/20 10/27/20  Lannie Fields, PA-C    Allergies Patient has no known allergies.  Family History  Problem Relation Age of Onset  . Hypertension Mother   . Hyperlipidemia Mother   . Cancer Father        lung cancer  . Breast cancer Cousin        either 21 or 9  . Asthma Sister   . Hypertension Sister   . Healthy Brother   . Hashimoto's thyroiditis Son   . Vitiligo Son   . Autoimmune disease Son   . Hyperthyroidism Daughter   . Colon cancer Neg Hx     Social History Social History   Tobacco Use  . Smoking status: Never Smoker  . Smokeless tobacco: Never Used  . Tobacco comment: Married, lives with spouse and 2 kids  Vaping Use  . Vaping Use: Never used  Substance Use Topics  . Alcohol use: No    Alcohol/week: 0.0 standard drinks  . Drug  use: No     Review of Systems  Constitutional: No fever/chills Eyes:  No discharge ENT: Patient has nasal congestion. Respiratory: Patient has cough. No SOB/ use of accessory muscles to breath Gastrointestinal:   No nausea, no vomiting.  No diarrhea.  No constipation. Musculoskeletal: Negative for musculoskeletal pain. Skin: Negative for rash, abrasions, lacerations, ecchymosis.    ____________________________________________   PHYSICAL EXAM:  VITAL SIGNS: ED Triage Vitals [10/20/20 1943]  Enc Vitals Group     BP (!) 152/87     Pulse Rate 78     Resp 18     Temp 99.3 F (37.4 C)     Temp Source Oral     SpO2 97 %     Weight      Height      Head Circumference      Peak Flow      Pain Score 2     Pain Loc      Pain Edu?      Excl. in [place]?      Constitutional: Alert and oriented. Patient is lying  supine. Eyes: Conjunctivae are normal. PERRL. EOMI. Head: Atraumatic. ENT:      Ears: Tympanic membranes are mildly injected with mild effusion bilaterally.       Nose: No congestion/rhinnorhea.      Mouth/Throat: Mucous membranes are moist. Posterior pharynx is mildly erythematous.  Hematological/Lymphatic/Immunilogical: No cervical lymphadenopathy.  Cardiovascular: Normal rate, regular rhythm. Normal S1 and S2.  Good peripheral circulation. Respiratory: Normal respiratory effort without tachypnea or retractions. Lungs CTAB. Good air entry to the bases with no decreased or absent breath sounds. Gastrointestinal: Bowel sounds 4 quadrants. Soft and nontender to palpation. No guarding or rigidity. No palpable masses. No distention. No CVA tenderness. Musculoskeletal: Full range of motion to all extremities. No gross deformities appreciated. Neurologic:  Normal speech and language. No gross focal neurologic deficits are appreciated.  Skin:  Skin is warm, dry and intact. No rash noted. Psychiatric: Mood and affect are normal. Speech and behavior are normal. Patient exhibits appropriate insight and judgement.    ____________________________________________   LABS (all labs ordered are listed, but only abnormal results are displayed)  Labs Reviewed  COVID-19, FLU A+B AND RSV  COVID-19, FLU A+B NAA   ____________________________________________  EKG   ____________________________________________  RADIOLOGY   No results found.  ____________________________________________    PROCEDURES  Procedure(s) performed:     Procedures     Medications - No data to display   ____________________________________________   INITIAL IMPRESSION / ASSESSMENT AND PLAN / ED COURSE  Pertinent labs & imaging results that were available during my care of the patient were reviewed by me and considered in my medical decision making (see chart for details).      Assessment and plan:   Viral URI 55 year old female presents to the urgent care with cold-like symptoms.  Patient was hypertensive at triage but vital signs were otherwise reassuring.  She had no adventitious lung sounds auscultated and no increased work of breathing.  Sendoff COVID-19 and influenza testing are in process at this time.  She was discharged with Ladona Ridgel and advised to continue using her Robitussin-DM.  Return precautions were given to return with new or worsening symptoms.  All patient questions were answered.     ____________________________________________  FINAL CLINICAL IMPRESSION(S) / ED DIAGNOSES  Final diagnoses:  Encounter for screening laboratory testing for COVID-19 virus  Cough      NEW MEDICATIONS STARTED DURING THIS VISIT:  ED Discharge Orders         Ordered    benzonatate (TESSALON PERLES) 100 MG capsule  3 times daily PRN,   Status:  Discontinued        10/20/20 1954    benzonatate (TESSALON PERLES) 100 MG capsule  3 times daily PRN        10/20/20 1959              This chart was dictated using voice recognition software/Dragon. Despite best efforts to proofread, errors can occur which can change the meaning. Any change was purely unintentional.     Lannie Fields, PA-C 10/20/20 2001

## 2020-10-22 LAB — COVID-19, FLU A+B NAA
Influenza A, NAA: NOT DETECTED
Influenza B, NAA: NOT DETECTED
SARS-CoV-2, NAA: DETECTED — AB

## 2020-10-29 ENCOUNTER — Telehealth: Payer: Self-pay | Admitting: Emergency Medicine

## 2020-10-29 ENCOUNTER — Ambulatory Visit
Admission: EM | Admit: 2020-10-29 | Discharge: 2020-10-29 | Disposition: A | Discharge status: Home / Self Care | Payer: 59 | Attending: Emergency Medicine | Admitting: Emergency Medicine

## 2020-10-29 DIAGNOSIS — R220 Localized swelling, mass and lump, head: Secondary | ICD-10-CM

## 2020-10-29 MED ORDER — MUPIROCIN 2 % EX OINT: 1.0000 "application " | TOPICAL_OINTMENT | Freq: Two times a day (BID) | CUTANEOUS | 0 refills | Status: DC

## 2020-10-29 MED ORDER — PREDNISONE 20 MG PO TABS: 20.0000 mg | ORAL_TABLET | Freq: Every day | ORAL | 0 refills | Status: AC

## 2020-10-29 NOTE — Discharge Instructions (Signed)
Prednisone daily for 5-7 days Bactroban twice daily Follow up if any symptoms not improving or worsening

## 2020-10-29 NOTE — Telephone Encounter (Signed)
Patient called and states she is having upper lip swelling, denies difficulty breathing at this time.  Unsure if she is having an allergic reaction.  Encouraged her to follow up with [place].  Patient verbalized understanding.

## 2020-10-29 NOTE — ED Triage Notes (Signed)
Pt presents today with a 3 day h/o irritation on her top lip and 1 day h/o swollen top lip. Pt reports that at the onset she felt tenderness to the area of skin beneath her nose and above her lip. She thought it was a pimple, but a time progressed the pain radiated to the full surface of her top lip with swelling. Last dose of benadryl taken 7p last night with no change in sxs. No rashes. No n/v/d. Denies pruritic sxs on her lip. Denies any lesions or oral sores. No difficulty swallowing, but notes that she is drinking and eating slowly due to swelling.

## 2020-10-30 NOTE — ED Provider Notes (Signed)
[facility]    CSN: [id number] Arrival date & time: 10/29/20  1904      History   Chief Complaint Chief Complaint  Patient presents with  . Oral Swelling    HPI Tina Stokes is a 55 y.o. female presenting today for evaluation of lip swelling and perioral sores.  Reports that approximately 3 days ago she began to develop a slight irritation and sore above her upper lip, initially thought this was a fever blister, then symptoms progressed more into her upper lip and developed lip swelling over the past 24 hours.  She denies any significant pain with this.  Denies any new lip products, foods.  Patient did recently test positive for COVID approximately 9 days ago, was using [employer] which she had never used before and is questioning if this may be a cause of her symptoms.  She took Benadryl last night, symptoms still present this morning.  She denies any other oral swelling, throat swelling or difficulty breathing.  Denies history of cold sores, fever blisters, HSV lesions. HPI  Past Medical History:  Diagnosis Date  . Acne   . Arthritis   . DEPRESSION   . Fibroid uterus 07/31/2013   Right anterior fibroid 31 x 21 x 33 mm subserous, intramural 16 x 17 mm.   . Lichen planus    of the scalp - sees dermatologist in [place]   . Raynaud's disease     Patient Active Problem List   Diagnosis Date Noted  . History of depression 05/16/2018  . Primary osteoarthritis of both knees 05/16/2018  . Primary osteoarthritis of both hands 05/16/2018  . Lichen planus 93/79/0240  . Raynaud's syndrome without gangrene 05/09/2018  . Other idiopathic scoliosis 04/11/2016  . Subserous leiomyoma of uterus 10/05/2015  . Mood disorder ([place]) 11/10/2014  . Acne   . Anemia 12/28/2009    Past Surgical History:  Procedure Laterality Date  . COLONOSCOPY  2006   normal  . PELVIC LAPAROSCOPY    . TUBAL LIGATION  01/1999    OB History    Gravida  3   Para  2    Term      Preterm      AB  1   Living  2     SAB      IAB      Ectopic      Multiple      Live Births               Home Medications    Prior to Admission medications   Medication Sig Start Date End Date Taking? Authorizing Provider  mupirocin ointment (BACTROBAN) 2 % Apply 1 application topically 2 (two) times daily. 10/29/20  Yes Rayshell Goecke C, PA-C  predniSONE (DELTASONE) 20 MG tablet Take 1 tablet (20 mg total) by mouth daily with breakfast for 7 days. 10/29/20 11/05/20 Yes Sheylin Scharnhorst C, PA-C  ALPRAZolam (XANAX) 0.25 MG tablet Take 1 tablet (0.25 mg total) by mouth as needed for anxiety. For flying only 09/02/20   [name], NP    Family History Family History  Problem Relation Age of Onset  . Hypertension Mother   . Hyperlipidemia Mother   . Cancer Father        lung cancer  . Breast cancer Cousin        either 18 or 39  . Asthma Sister   . Hypertension Sister   . Healthy Brother   .  Hashimoto's thyroiditis Son   . Vitiligo Son   . Autoimmune disease Son   . Hyperthyroidism Daughter   . Colon cancer Neg Hx     Social History Social History   Tobacco Use  . Smoking status: Never Smoker  . Smokeless tobacco: Never Used  . Tobacco comment: Married, lives with spouse and 2 kids  Vaping Use  . Vaping Use: Never used  Substance Use Topics  . Alcohol use: No    Alcohol/week: 0.0 standard drinks  . Drug use: No     Allergies   Patient has no known allergies.   Review of Systems Review of Systems  Constitutional: Negative for fatigue and fever.  HENT: Positive for facial swelling. Negative for mouth sores.   Eyes: Negative for visual disturbance.  Respiratory: Negative for shortness of breath.   Cardiovascular: Negative for chest pain.  Gastrointestinal: Negative for abdominal pain, nausea and vomiting.  Genitourinary: Negative for genital sores.  Musculoskeletal: Negative for arthralgias and joint swelling.  Skin: Positive for  wound. Negative for color change and rash.  Neurological: Negative for dizziness, weakness, light-headedness and headaches.     Physical Exam Triage Vital Signs ED Triage Vitals  Enc Vitals Group     BP 10/29/20 1936 (!) 143/83     Pulse Rate 10/29/20 1936 76     Resp 10/29/20 1936 18     Temp 10/29/20 1936 98.3 F (36.8 C)     Temp Source 10/29/20 1936 Oral     SpO2 10/29/20 1936 98 %     Weight --      Height --      Head Circumference --      Peak Flow --      Pain Score 10/29/20 1942 0     Pain Loc --      Pain Edu? --      Excl. in [place]? --    No data found.  Updated Vital Signs BP (!) 143/83 (BP Location: Right Arm)   Pulse 76   Temp 98.3 F (36.8 C) (Oral)   Resp 18   LMP 06/30/2018   SpO2 98%   Visual Acuity Right Eye Distance:   Left Eye Distance:   Bilateral Distance:    Right Eye Near:   Left Eye Near:    Bilateral Near:     Physical Exam Vitals and nursing note reviewed.  Constitutional:      Appearance: She is well-developed.     Comments: No acute distress  HENT:     Head: Normocephalic and atraumatic.     Nose: Nose normal.     Mouth/Throat:     Comments: Upper lip with swelling and central slightly ulcerated/crusted lesion centrally, similar lesion noted just above lip and inferonasal area  No other swelling or lesions identified within mouth Eyes:     Conjunctiva/sclera: Conjunctivae normal.  Cardiovascular:     Rate and Rhythm: Normal rate.  Pulmonary:     Effort: Pulmonary effort is normal. No respiratory distress.  Abdominal:     General: There is no distension.  Musculoskeletal:        General: Normal range of motion.     Cervical back: Neck supple.  Skin:    General: Skin is warm and dry.  Neurological:     Mental Status: She is alert and oriented to person, place, and time.      UC Treatments / Results  Labs (all labs ordered are listed, but only abnormal results  are displayed) Labs Reviewed - No data to  display  EKG   Radiology No results found.  Procedures Procedures (including critical care time)  Medications Ordered in UC Medications - No data to display  Initial Impression / Assessment and Plan / UC Course  I have reviewed the triage vital signs and the nursing notes.  Pertinent labs & imaging results that were available during my care of the patient were reviewed by me and considered in my medical decision making (see chart for details).     Possible allergic reaction to Tessalon, will treat with prednisone along with continuing antihistamines.  Sores questionable viral versus other irritating cause.  No prior history of HSV/cold sores.  Deferring treatment with any antivirals at this time and will continue to monitor, Bactroban topically to help prevent secondary infection.  Discussed strict return precautions. Patient verbalized understanding and is agreeable with plan.  Final Clinical Impressions(s) / UC Diagnoses   Final diagnoses:  Swelling of upper lip     Discharge Instructions     Prednisone daily for 5-7 days Bactroban twice daily Follow up if any symptoms not improving or worsening   ED Prescriptions    Medication Sig Dispense Auth. Provider   predniSONE (DELTASONE) 20 MG tablet Take 1 tablet (20 mg total) by mouth daily with breakfast for 7 days. 7 tablet Wava Kildow C, PA-C   mupirocin ointment (BACTROBAN) 2 % Apply 1 application topically 2 (two) times daily. 30 g Aslan Himes, [place] C, PA-C     PDMP not reviewed this encounter.   Janith Lima, [place] 10/30/20 [phone]

## 2020-12-15 ENCOUNTER — Other Ambulatory Visit: Payer: Self-pay | Admitting: Nurse Practitioner

## 2020-12-15 ENCOUNTER — Telehealth: Payer: Self-pay

## 2020-12-15 ENCOUNTER — Other Ambulatory Visit: Payer: Self-pay | Admitting: Family Medicine

## 2020-12-15 DIAGNOSIS — B3731 Acute candidiasis of vulva and vagina: Secondary | ICD-10-CM

## 2020-12-15 DIAGNOSIS — B373 Candidiasis of vulva and vagina: Secondary | ICD-10-CM

## 2020-12-15 MED ORDER — FLUCONAZOLE 150 MG PO TABS: 150.0000 mg | ORAL_TABLET | ORAL | 0 refills | Status: DC

## 2020-12-15 NOTE — Telephone Encounter (Signed)
Patient informed. 

## 2020-12-15 NOTE — Telephone Encounter (Signed)
Patient called complaining of yeast infection. She said she took a bath and put some bath "stuff" in it and ever since she is having vaginal itching.  She had a Diflucan prescription from her PCP and had refills on it but they had expired in May.  She asked if you could send her in a Rx for Diflucan.

## 2020-12-15 NOTE — Telephone Encounter (Signed)
Sent.  Thank you.

## 2021-02-22 ENCOUNTER — Other Ambulatory Visit: Payer: Self-pay

## 2021-02-22 ENCOUNTER — Ambulatory Visit (INDEPENDENT_AMBULATORY_CARE_PROVIDER_SITE_OTHER): Payer: 59 | Admitting: Nurse Practitioner

## 2021-02-22 ENCOUNTER — Other Ambulatory Visit (HOSPITAL_COMMUNITY)
Admission: RE | Admit: 2021-02-22 | Discharge: 2021-02-22 | Disposition: A | Discharge status: Home / Self Care | Payer: 59 | Source: Ambulatory Visit | Attending: Nurse Practitioner | Admitting: Nurse Practitioner

## 2021-02-22 ENCOUNTER — Ambulatory Visit: Payer: Self-pay | Admitting: Nurse Practitioner

## 2021-02-22 ENCOUNTER — Encounter: Payer: Self-pay | Admitting: Nurse Practitioner

## 2021-02-22 VITALS — BP 118/76 | Ht 61.0 in | Wt 139.0 lb

## 2021-02-22 DIAGNOSIS — E785 Hyperlipidemia, unspecified: Secondary | ICD-10-CM

## 2021-02-22 DIAGNOSIS — Z01419 Encounter for gynecological examination (general) (routine) without abnormal findings: Secondary | ICD-10-CM | POA: Insufficient documentation

## 2021-02-22 DIAGNOSIS — Z780 Asymptomatic menopausal state: Secondary | ICD-10-CM | POA: Diagnosis not present

## 2021-02-22 DIAGNOSIS — Z8639 Personal history of other endocrine, nutritional and metabolic disease: Secondary | ICD-10-CM

## 2021-02-22 DIAGNOSIS — N951 Menopausal and female climacteric states: Secondary | ICD-10-CM

## 2021-02-22 NOTE — Progress Notes (Signed)
   Tina Stokes Oct 20, 1965 [id number]   History:  55 y.o. [id number] presents for annual exam. Postmenopausal - no HRT, no bleeding. Has occasional hot flashes and vaginal dryness/pain with intercourse. She uses OTC lubrication with some relief. Normal pap and mammogram history. History of anemia and vitamin D deficiency.   Gynecologic History Contraception: post menopausal status and tubal ligation  Health maintenance Last Pap: 02/13/2016 . Results were: normal Last mammogram: 12/09/2020. Results were: normal Last colonoscopy: 01/13/2015. Results were: severe diverticulosis, 10-year recall Last Dexa: Not indicated  Past medical history, past surgical history, family history and social history were all reviewed and documented in the EPIC chart. Married. Hairdresser. Daughter living in [place], working as Biomedical scientist. Son lives here.   ROS:  A ROS was performed and pertinent positives and negatives are included.  Exam:  Vitals:   02/22/21 1112  BP: 118/76  Weight: 139 lb (63 kg)  Height: 5\' 1"  (1.549 m)    Body mass index is 26.26 kg/m.  General appearance:  Normal Thyroid:  Symmetrical, normal in size, without palpable masses or nodularity. Respiratory  Auscultation:  Clear without wheezing or rhonchi Cardiovascular  Auscultation:  Regular rate, without rubs, murmurs or gallops  Edema/varicosities:  Not grossly evident Abdominal  Soft,nontender, without masses, guarding or rebound.  Liver/spleen:  No organomegaly noted  Hernia:  None appreciated  Skin  Inspection:  Grossly normal   Breasts: Examined lying and sitting.   Right: Without masses, retractions, discharge or axillary adenopathy.   Left: Without masses, retractions, discharge or axillary adenopathy. Gentitourinary   Inguinal/mons:  Normal without inguinal adenopathy  External genitalia:  Normal  BUS/Urethra/Skene's glands:  Normal  Vagina:  Normal  Cervix:  Normal  Uterus:  Normal in size, shape and contour.   Midline and mobile  Adnexa/parametria:     Rt: Without masses or tenderness.   Lt: Without masses or tenderness.  Anus and perineum: Normal  Digital rectal exam: Normal sphincter tone without palpated masses or tenderness  Patient informed chaperone available to be present for breast and pelvic exam. Patient has requested no chaperone to be present. Patient has been advised what will be completed during breast and pelvic exam.   Assessment/Plan:  55 y.o. [id number] for annual exam.   Well female exam with routine gynecological exam - Plan: CBC with Differential/Platelet, Comprehensive metabolic panel, Cytology - PAP( [facility]). Education provided on SBEs, importance of preventative screenings, current guidelines, high calcium diet, regular exercise, and multivitamin daily.   Postmenopausal - no HRT, no bleeding.   History of vitamin D deficiency - Plan: VITAMIN D 25 Hydroxy (Vit-D Deficiency, Fractures). Taking supplement daily.   Hyperlipidemia, unspecified hyperlipidemia type - Plan: Lipid panel  Vaginal dryness, menopausal - using KY jelly. Recommend switching to oil-based lubricant such as coconut oil. If no improvement we can discuss vaginal estrogen.   Screening for cervical cancer - Normal Pap history. Pap today.   Screening for breast cancer - Normal mammogram history.  Continue annual screenings.  Normal breast exam today.  Screening for colon cancer - 2016 colonoscopy. Will repeat at GI's recommended interval.   Follow up in 1 year for annual.     Tina Stokes [facility], 11:24 AM 02/22/2021

## 2021-02-23 LAB — COMPREHENSIVE METABOLIC PANEL
AG Ratio: 1.8 (calc) (ref 1.0–2.5)
ALT: 8 U/L (ref 6–29)
AST: 13 U/L (ref 10–35)
Albumin: 4.5 g/dL (ref 3.6–5.1)
Alkaline phosphatase (APISO): 55 U/L (ref 37–153)
BUN: 12 mg/dL (ref 7–25)
CO2: 27 mmol/L (ref 20–32)
Calcium: 9.7 mg/dL (ref 8.6–10.4)
Chloride: 105 mmol/L (ref 98–110)
Creat: 0.73 mg/dL (ref 0.50–1.03)
Globulin: 2.5 g/dL (calc) (ref 1.9–3.7)
Glucose, Bld: 81 mg/dL (ref 65–99)
Potassium: 4.4 mmol/L (ref 3.5–5.3)
Sodium: 142 mmol/L (ref 135–146)
Total Bilirubin: 0.5 mg/dL (ref 0.2–1.2)
Total Protein: 7 g/dL (ref 6.1–8.1)

## 2021-02-23 LAB — CBC WITH DIFFERENTIAL/PLATELET
Absolute Monocytes: 402 cells/uL (ref 200–950)
Basophils Absolute: 28 cells/uL (ref 0–200)
Basophils Relative: 0.5 %
Eosinophils Absolute: 182 cells/uL (ref 15–500)
Eosinophils Relative: 3.3 %
HCT: 39.5 % (ref 35.0–45.0)
Hemoglobin: 13.2 g/dL (ref 11.7–15.5)
Lymphs Abs: 2222 cells/uL (ref 850–3900)
MCH: 29.9 pg (ref 27.0–33.0)
MCHC: 33.4 g/dL (ref 32.0–36.0)
MCV: 89.6 fL (ref 80.0–100.0)
MPV: 10.7 fL (ref 7.5–12.5)
Monocytes Relative: 7.3 %
Neutro Abs: 2668 cells/uL (ref 1500–7800)
Neutrophils Relative %: 48.5 %
Platelets: 295 10*3/uL (ref 140–400)
RBC: 4.41 10*6/uL (ref 3.80–5.10)
RDW: 12.8 % (ref 11.0–15.0)
Total Lymphocyte: 40.4 %
WBC: 5.5 10*3/uL (ref 3.8–10.8)

## 2021-02-23 LAB — CYTOLOGY - PAP
Comment: NEGATIVE
Diagnosis: NEGATIVE
High risk HPV: NEGATIVE

## 2021-02-23 LAB — VITAMIN D 25 HYDROXY (VIT D DEFICIENCY, FRACTURES): Vit D, 25-Hydroxy: 43 ng/mL (ref 30–100)

## 2021-02-23 LAB — LIPID PANEL
Cholesterol: 212 mg/dL — ABNORMAL HIGH (ref <200)
HDL: 112 mg/dL (ref >=50)
LDL Cholesterol (Calc): 84 mg/dL (calc)
Non-HDL Cholesterol (Calc): 100 mg/dL (calc) (ref <130)
Total CHOL/HDL Ratio: 1.9 (calc) (ref <5.0)
Triglycerides: 74 mg/dL (ref <150)

## 2021-05-17 ENCOUNTER — Other Ambulatory Visit: Payer: Self-pay

## 2021-05-18 ENCOUNTER — Ambulatory Visit (INDEPENDENT_AMBULATORY_CARE_PROVIDER_SITE_OTHER): Payer: 59 | Admitting: Family Medicine

## 2021-05-18 ENCOUNTER — Encounter: Payer: Self-pay | Admitting: Family Medicine

## 2021-05-18 VITALS — BP 118/74 | HR 70 | Temp 97.2°F | Ht 61.0 in | Wt 141.0 lb

## 2021-05-18 DIAGNOSIS — F418 Other specified anxiety disorders: Secondary | ICD-10-CM | POA: Diagnosis not present

## 2021-05-18 DIAGNOSIS — L299 Pruritus, unspecified: Secondary | ICD-10-CM

## 2021-05-18 DIAGNOSIS — Z23 Encounter for immunization: Secondary | ICD-10-CM

## 2021-05-18 NOTE — Addendum Note (Signed)
Addended by: Konrad Saha on: 05/18/2021 04:11 PM   Modules accepted: Orders

## 2021-05-18 NOTE — Progress Notes (Signed)
[facility] [address] [place] [address] Dept: [phone] Dept Fax: [phone]  Transfer of Care Office Visit  Subjective:    Patient ID: Tina Stokes, female    DOB: 1965-08-26, 55 y.o..   MRN: [id number]  Chief Complaint  Patient presents with   Establish Care    [facility]- establish care.  No concerns.   Declines flu shot today.      History of Present Illness:  Patient is in today to establish care. Tina Stokes was born in [place]. She lived for a short time in [place] and [place], but has been in [place] most of her life. She attended [employer] 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 She has been married for 37 years. She has two children (26, 22). She denies tobacco or drug use and drinks only occasionally.  Tina Stokes has a history fo Raynaud's syndrome. 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 She notes she has to be careful about staying out in cold weather too long. When she does, her distal fingers will turn white and be painful.  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 This developed after a period of wearing false braids in her hair. She has seen multiple dermatologist and neurologist  that have tried a multitude of therapies, none of which led to significant improvements in her condition.  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 She uses Xanax for these episodes.   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   Other idiopathic scoliosis 04/11/2016   Subserous leiomyoma of uterus 10/05/2015   Acne    Anemia 12/28/2009   Past Surgical History:  Procedure Laterality Date   COLONOSCOPY  2006   normal   PELVIC LAPAROSCOPY     TUBAL LIGATION  01/1999   Family History  Problem Relation Age of Onset   Hypertension Mother    Hyperlipidemia Mother    Cancer Father        lung cancer   Breast cancer Cousin        either 28 or 40   Asthma Sister    Hypertension Sister    Healthy Brother    Hashimoto's thyroiditis Son    Vitiligo Son    Autoimmune disease Son    Hyperthyroidism Daughter    Colon cancer Neg Hx    Outpatient Medications Prior to Visit  Medication Sig Dispense Refill   ALPRAZolam (XANAX) 0.25 MG tablet Take 1 tablet (0.25 mg total) by mouth as needed for anxiety. For flying only 10 tablet 0   Multiple Vitamin (MULTI-VITAMIN PO) Take by mouth.     VITAMIN D PO Take by mouth.     No facility-administered medications prior to visit.   No Known Allergies    Objective:   Today's Vitals   05/18/21 1457  BP: 118/74  Pulse: 70  Temp: (!) 97.2 F (36.2 C)  TempSrc: Temporal  SpO2: 99%  Weight: 141 lb (64 kg)  Height: 5\' 1"  (1.549 m)  Body mass index is 26.64 kg/m.   General: Well developed, well nourished. No acute distress. Psych: Alert and oriented. Normal mood and affect.  Health Maintenance Due  Topic Date Due   Pneumococcal Vaccine 9-78 Years old (1 - PCV) Never done   Zoster Vaccines- Shingrix (1 of 2) Never done   COVID-19 Vaccine (3 - Pfizer risk series) 10/12/2019   INFLUENZA VACCINE  12/28/2020   Lab Results Last CBC Lab Results  Component Value Date   WBC 5.5 02/22/2021   HGB 13.2 02/22/2021   HCT 39.5 02/22/2021   MCV 89.6 02/22/2021   MCH 29.9 02/22/2021   RDW 12.8 02/22/2021   PLT 295 11/20/7626   Last metabolic panel Lab Results  Component Value Date   GLUCOSE 81 02/22/2021   NA 142 02/22/2021   K 4.4  02/22/2021   CL 105 02/22/2021   CO2 27 02/22/2021   BUN 12 02/22/2021   CREATININE 0.73 02/22/2021   GFRNONAA 84 05/16/2018   CALCIUM 9.7 02/22/2021   PROT 7.0 02/22/2021   ALBUMIN 3.8 12/28/2009   BILITOT 0.5 02/22/2021   ALKPHOS 42 12/28/2009   AST 13 02/22/2021   ALT 8 02/22/2021   Last lipids Lab Results  Component Value Date   CHOL 212 (H) 02/22/2021   HDL 112 02/22/2021   LDLCALC 84 02/22/2021   TRIG 74 02/22/2021   CHOLHDL 1.9 02/22/2021   Last hemoglobin A1c Lab Results  Component Value Date   HGBA1C 5.3 11/10/2014   Last thyroid functions Lab Results  Component Value Date   TSH 2.07 02/19/2020   Last vitamin D Lab Results  Component Value Date   VD25OH 43 02/22/2021    Assessment & Plan:   1. Situational anxiety- Flying I will plan to continue to provide Xanax when needed for plane flights.  2. Scalp pruritus [name] has had extensive evaluation and treatments for this condition. We did discuss a focus on symptom management and approaches to living with the condition.  Tina Salter, MD

## 2021-08-18 ENCOUNTER — Ambulatory Visit (INDEPENDENT_AMBULATORY_CARE_PROVIDER_SITE_OTHER): Payer: 59

## 2021-08-18 DIAGNOSIS — Z23 Encounter for immunization: Secondary | ICD-10-CM | POA: Diagnosis not present

## 2021-08-18 NOTE — Progress Notes (Signed)
Per orders of Dr.Rudd, pt is here for Shingles immunization (second dose). pt received immunization in right deltoid at 3:10 pm. Given by [place] L. CMA/CPT. Pt tolerated immunization well.  ?Shingles Series is completed.  ?

## 2021-12-15 DIAGNOSIS — Z1231 Encounter for screening mammogram for malignant neoplasm of breast: Secondary | ICD-10-CM | POA: Diagnosis not present

## 2021-12-16 ENCOUNTER — Encounter: Payer: Self-pay | Admitting: Nurse Practitioner

## 2022-03-02 ENCOUNTER — Ambulatory Visit (INDEPENDENT_AMBULATORY_CARE_PROVIDER_SITE_OTHER): Payer: 59 | Admitting: Nurse Practitioner

## 2022-03-02 ENCOUNTER — Encounter: Payer: Self-pay | Admitting: Nurse Practitioner

## 2022-03-02 VITALS — BP 96/62 | HR 86 | Ht 60.75 in | Wt 139.0 lb

## 2022-03-02 DIAGNOSIS — E559 Vitamin D deficiency, unspecified: Secondary | ICD-10-CM | POA: Diagnosis not present

## 2022-03-02 DIAGNOSIS — Z780 Asymptomatic menopausal state: Secondary | ICD-10-CM

## 2022-03-02 DIAGNOSIS — E785 Hyperlipidemia, unspecified: Secondary | ICD-10-CM | POA: Diagnosis not present

## 2022-03-02 DIAGNOSIS — Z01419 Encounter for gynecological examination (general) (routine) without abnormal findings: Secondary | ICD-10-CM | POA: Diagnosis not present

## 2022-03-02 DIAGNOSIS — N898 Other specified noninflammatory disorders of vagina: Secondary | ICD-10-CM

## 2022-03-02 LAB — WET PREP FOR TRICH, YEAST, CLUE

## 2022-03-02 NOTE — Progress Notes (Signed)
   Tina Stokes 01-10-1966 [id number]   History:  56 y.o. [id number] presents for annual exam. Reports noticing vaginal odor without itching or discharge. Postmenopausal - no HRT, no bleeding. Normal pap history. History of anemia and vitamin D deficiency.   Gynecologic History Contraception: post menopausal status and tubal ligation Sexually active: Yes  Health maintenance Last Pap: 02/22/2021 . Results were: Normal neg HPV Last mammogram: 12/15/2021. Results were: Normal Last colonoscopy: 01/13/2015. Results were: Severe diverticulosis, 10-year recall Last Dexa: Not indicated  Past medical history, past surgical history, family history and social history were all reviewed and documented in the EPIC chart. 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 Son lives here, getting married next year.    ROS:  A ROS was performed and pertinent positives and negatives are included.  Exam:  Vitals:   03/02/22 1110  BP: 96/62  Pulse: 86  SpO2: 99%  Weight: 139 lb (63 kg)  Height: 5' 0.75" (1.543 m)     Body mass index is 26.48 kg/m.  General appearance:  Normal Thyroid:  Symmetrical, normal in size, without palpable masses or nodularity. Respiratory  Auscultation:  Clear without wheezing or rhonchi Cardiovascular  Auscultation:  Regular rate, without rubs, murmurs or gallops  Edema/varicosities:  Not grossly evident Abdominal  Soft,nontender, without masses, guarding or rebound.  Liver/spleen:  No organomegaly noted  Hernia:  None appreciated  Skin  Inspection:  Grossly normal   Breasts: Examined lying and sitting.   Right: Without masses, retractions, discharge or axillary adenopathy.   Left: Without masses, retractions, discharge or axillary adenopathy. Genitourinary   Inguinal/mons:  Normal without inguinal adenopathy  External genitalia:  Normal appearing vulva with no masses, tenderness, or lesions  BUS/Urethra/Skene's glands:  Normal  Vagina:   Normal appearing with normal color and discharge, no lesions  Cervix:  Normal appearing without discharge or lesions  Uterus:  Normal in size, shape and contour.  Midline and mobile, nontender  Adnexa/parametria:     Rt: Normal in size, without masses or tenderness.   Lt: Normal in size, without masses or tenderness.  Anus and perineum: Normal  Digital rectal exam: Normal sphincter tone without palpated masses or tenderness  Patient informed chaperone available to be present for breast and pelvic exam. Patient has requested no chaperone to be present. Patient has been advised what will be completed during breast and pelvic exam.   Wet prep negative  Assessment/Plan:  56 y.o. [id number] for annual exam.   Well female exam with routine gynecological exam - Plan: CBC with Differential/Platelet, Comprehensive metabolic panel. Education provided on SBEs, importance of preventative screenings, current guidelines, high calcium diet, regular exercise, and multivitamin daily.   Postmenopausal - no HRT, no bleeding.   History of vitamin D deficiency - Plan: VITAMIN D 25 Hydroxy (Vit-D Deficiency, Fractures). Taking supplement daily.   Hyperlipidemia, unspecified hyperlipidemia type - Plan: Lipid panel  Vaginal odor - Plan: WET PREP FOR [place], YEAST, CLUE. Negative wet prep and exam.  Screening for cervical cancer - Normal Pap history. Will repeat at 5-year interval per guidelines.   Screening for breast cancer - Normal mammogram history.  Continue annual screenings.  Normal breast exam today.  Screening for colon cancer - 2016 colonoscopy. Will repeat at GI's recommended interval.   Follow up in 1 year for annual.     [name] [facility], 11:22 AM 03/02/2022

## 2022-03-03 LAB — LIPID PANEL
Cholesterol: 199 mg/dL (ref <200)
HDL: 106 mg/dL (ref >=50)
LDL Cholesterol (Calc): 79 mg/dL (calc)
Non-HDL Cholesterol (Calc): 93 mg/dL (calc) (ref <130)
Total CHOL/HDL Ratio: 1.9 (calc) (ref <5.0)
Triglycerides: 60 mg/dL (ref <150)

## 2022-03-03 LAB — COMPREHENSIVE METABOLIC PANEL
AG Ratio: 2 (calc) (ref 1.0–2.5)
ALT: 10 U/L (ref 6–29)
AST: 11 U/L (ref 10–35)
Albumin: 4.3 g/dL (ref 3.6–5.1)
Alkaline phosphatase (APISO): 51 U/L (ref 37–153)
BUN: 11 mg/dL (ref 7–25)
CO2: 27 mmol/L (ref 20–32)
Calcium: 9.5 mg/dL (ref 8.6–10.4)
Chloride: 107 mmol/L (ref 98–110)
Creat: 0.79 mg/dL (ref 0.50–1.03)
Globulin: 2.2 g/dL (calc) (ref 1.9–3.7)
Glucose, Bld: 83 mg/dL (ref 65–99)
Potassium: 4.5 mmol/L (ref 3.5–5.3)
Sodium: 144 mmol/L (ref 135–146)
Total Bilirubin: 0.4 mg/dL (ref 0.2–1.2)
Total Protein: 6.5 g/dL (ref 6.1–8.1)

## 2022-03-03 LAB — VITAMIN D 25 HYDROXY (VIT D DEFICIENCY, FRACTURES): Vit D, 25-Hydroxy: 48 ng/mL (ref 30–100)

## 2022-03-03 LAB — CBC WITH DIFFERENTIAL/PLATELET
Absolute Monocytes: 383 cells/uL (ref 200–950)
Basophils Absolute: 18 cells/uL (ref 0–200)
Basophils Relative: 0.4 %
Eosinophils Absolute: 122 cells/uL (ref 15–500)
Eosinophils Relative: 2.7 %
HCT: 37.5 % (ref 35.0–45.0)
Hemoglobin: 12.7 g/dL (ref 11.7–15.5)
Lymphs Abs: 1971 cells/uL (ref 850–3900)
MCH: 31.1 pg (ref 27.0–33.0)
MCHC: 33.9 g/dL (ref 32.0–36.0)
MCV: 91.7 fL (ref 80.0–100.0)
MPV: 10.3 fL (ref 7.5–12.5)
Monocytes Relative: 8.5 %
Neutro Abs: 2007 cells/uL (ref 1500–7800)
Neutrophils Relative %: 44.6 %
Platelets: 288 10*3/uL (ref 140–400)
RBC: 4.09 10*6/uL (ref 3.80–5.10)
RDW: 13.1 % (ref 11.0–15.0)
Total Lymphocyte: 43.8 %
WBC: 4.5 10*3/uL (ref 3.8–10.8)

## 2022-04-18 ENCOUNTER — Other Ambulatory Visit: Payer: Self-pay | Admitting: *Deleted

## 2022-04-18 DIAGNOSIS — F419 Anxiety disorder, unspecified: Secondary | ICD-10-CM

## 2022-04-18 MED ORDER — ALPRAZOLAM 0.25 MG PO TABS: 0.2500 mg | ORAL_TABLET | ORAL | 0 refills | Status: AC | PRN

## 2022-04-18 NOTE — Telephone Encounter (Signed)
Patient called and left message requesting refill to [facility] on [address].   Med refill request: Xanax 0.25 mg tab Last AEX: 03/02/22 -TW Next AEX: 03/06/23 Last MMG (if hormonal med) N/A Refill authorized: Please Advise?

## 2022-05-26 ENCOUNTER — Ambulatory Visit (INDEPENDENT_AMBULATORY_CARE_PROVIDER_SITE_OTHER): Payer: 59 | Admitting: Family Medicine

## 2022-05-26 ENCOUNTER — Encounter: Payer: Self-pay | Admitting: Family Medicine

## 2022-05-26 VITALS — BP 116/70 | HR 59 | Temp 97.5°F | Ht 60.75 in | Wt 139.2 lb

## 2022-05-26 DIAGNOSIS — Z0000 Encounter for general adult medical examination without abnormal findings: Secondary | ICD-10-CM | POA: Diagnosis not present

## 2022-05-26 NOTE — Progress Notes (Signed)
[facility] [address] [place] [address] Dept: [phone] Dept Fax: [phone]  Annual Physical Visit  Subjective:    Patient ID: Tina Stokes, female    DOB: [birth year], 56 y.o..   MRN: [id number]  Chief Complaint  Patient presents with   Annual Exam    CPE/labs., no concerns,   fasting today.      History of Present Illness:  Patient is in today for an annual physical/preventative visit.  Review of Systems  Constitutional:  Negative for chills, diaphoresis, fever, malaise/fatigue and weight loss.  HENT:  Negative for congestion, ear discharge, ear pain, hearing loss, sinus pain, sore throat and tinnitus.   Eyes:  Negative for blurred vision, pain, discharge and redness.  Respiratory:  Negative for cough, shortness of breath and wheezing.   Cardiovascular:  Negative for chest pain, palpitations and leg swelling.  Gastrointestinal:  Positive for constipation. Negative for abdominal pain, diarrhea, heartburn, nausea and vomiting.       Mild chronic constipation. Previously told she was borderline IBS. Uses occasional Miralax.  Musculoskeletal:  Positive for joint pain. Negative for back pain, myalgias and neck pain.       Mild occasional bilateral knee joint pain.  Skin:        History of chronic scalp pruritus. Previously seen at [facility] and [facility]. Prior biopsies. She was told this looked lupus-like, but testing was neg. Previously was treated with gabapentin, but did not like the way this made her feel.  Neurological: Negative.   Endo/Heme/Allergies:  Negative for environmental allergies.  Psychiatric/Behavioral:  Negative for depression. The patient is not nervous/anxious.    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   History of depression 05/16/2018   Primary osteoarthritis of both knees 05/16/2018    Primary osteoarthritis of both hands 29/47/6546   Lichen planus 50/35/4656   Raynaud's syndrome without gangrene 05/09/2018   Capillary hemangioma 07/26/2017   Other idiopathic scoliosis 04/11/2016   Subserous leiomyoma of uterus 10/05/2015   Acne    Anemia 12/28/2009   Past Surgical History:  Procedure Laterality Date   COLONOSCOPY  2006   normal   PELVIC LAPAROSCOPY     TUBAL LIGATION  01/1999   Family History  Problem Relation Age of Onset   Hypertension Mother    Hyperlipidemia Mother    Cancer Father        lung cancer   Breast cancer Cousin        either 36 or 43   Asthma Sister    Hypertension Sister    Healthy Brother    Hashimoto's thyroiditis Son    Vitiligo Son    Autoimmune disease Son    Hyperthyroidism Daughter    Colon cancer Neg Hx    Outpatient Medications Prior to Visit  Medication Sig Dispense Refill   ALPRAZolam (XANAX) 0.25 MG tablet Take 1 tablet (0.25 mg total) by mouth as needed for anxiety. For flying only 10 tablet 0   Multiple Vitamin (MULTI-VITAMIN PO) Take by mouth.     Multiple Vitamins-Minerals (HAIR SKIN & NAILS ADVANCED PO) Take by mouth. Nutrafol     VITAMIN D PO Take by mouth.     No facility-administered medications prior to visit.   No Known Allergies    Objective:   Today's Vitals   05/26/22 0839  BP: 116/70  Pulse: (!) 59  Temp: Marland Kitchen)  97.5 F (36.4 C)  TempSrc: Temporal  SpO2: 99%  Weight: 139 lb 3.2 oz (63.1 kg)  Height: 5' 0.75" (1.543 m)   Body mass index is 26.52 kg/m.   General: Well developed, well nourished. No acute distress. HEENT: Normocephalic, non-traumatic. PERRL, EOMI. Conjunctiva clear. External ears normal. EAC and   TMs normal bilaterally. Nose clear without congestion or rhinorrhea. Mucous membranes moist.   Oropharynx clear. Good dentition. Neck: Supple. No lymphadenopathy. No thyromegaly. Lungs: Clear to auscultation bilaterally. No wheezing, rales or rhonchi. CV: RRR without murmurs or rubs.  Pulses 2+ bilaterally. Abdomen: Soft, non-tender. Bowel sounds positive, normal pitch and frequency. No hepatosplenomegaly.   No rebound or guarding. Back: Straight. No CVA tenderness bilaterally. Extremities: Full ROM. No joint swelling or tenderness. No edema noted. Skin: Warm and dry. No rashes. Psych: Alert and oriented. Normal mood and affect.  There are no preventive care reminders to display for this patient.  Lab Results Last CBC Lab Results  Component Value Date   WBC 4.5 03/02/2022   HGB 12.7 03/02/2022   HCT 37.5 03/02/2022   MCV 91.7 03/02/2022   MCH 31.1 03/02/2022   RDW 13.1 03/02/2022   PLT 288 23/53/6144   Last metabolic panel Lab Results  Component Value Date   GLUCOSE 83 03/02/2022   NA 144 03/02/2022   K 4.5 03/02/2022   CL 107 03/02/2022   CO2 27 03/02/2022   BUN 11 03/02/2022   CREATININE 0.79 03/02/2022   GFRNONAA 84 05/16/2018   CALCIUM 9.5 03/02/2022   PROT 6.5 03/02/2022   ALBUMIN 3.8 12/28/2009   BILITOT 0.4 03/02/2022   ALKPHOS 42 12/28/2009   AST 11 03/02/2022   ALT 10 03/02/2022   Last lipids Lab Results  Component Value Date   CHOL 199 03/02/2022   HDL 106 03/02/2022   LDLCALC 79 03/02/2022   TRIG 60 03/02/2022   CHOLHDL 1.9 03/02/2022     Assessment & Plan:   1. Annual physical exam Overall excellent health. Up to date on health screenings and immunizations. We did discuss her including regular exercise in her daily routine. I recommend 30 min. of moderate-intensity exercise most days of the week.  Return in about 1 year (around 05/27/2023) for Annual preventative care.   Haydee Salter, MD

## 2022-07-20 ENCOUNTER — Telehealth: Payer: Self-pay | Admitting: Family Medicine

## 2022-07-20 DIAGNOSIS — L299 Pruritus, unspecified: Secondary | ICD-10-CM

## 2022-07-20 NOTE — Telephone Encounter (Signed)
Caller Name: Tina Stokes  Call back phone #: [phone]  Reason for Call: Pt saw Dr Gena Fray in December for a scalp issue. It was suggested she call  [facility] tel [phone] fax # [phone]. They have informed her they have changed their procedure and need a referral. Please let her know when it is made.

## 2022-07-20 NOTE — Telephone Encounter (Signed)
Will you place an an order for [facility] for patient, then I will cal her to let her know.   Thanks. Dm/cma

## 2022-07-20 NOTE — Telephone Encounter (Signed)
LFT detailed VM that referral has been placed and to give them up to a week.  If she doesn't hear any thing to call them directly.   Dm/cma

## 2022-07-28 ENCOUNTER — Telehealth: Payer: Self-pay | Admitting: Family Medicine

## 2022-07-28 NOTE — Telephone Encounter (Signed)
Caller Name: [place] Call back phone #: [phone]  Reason for Call: pt stated she gave the wrong fax # for [facility] so they have not received her referral. The correct # is [phone] please resend.

## 2022-07-29 NOTE — Telephone Encounter (Signed)
Pt is wanting to verify that the fax was sent to this # is [phone]. Please advise pt at [phone]

## 2022-08-02 ENCOUNTER — Telehealth: Payer: Self-pay | Admitting: Family Medicine

## 2022-08-02 NOTE — Telephone Encounter (Signed)
Spoke to patient, she will have to find another dermatogist that will take her insurance.  Dm/cma

## 2022-08-02 NOTE — Telephone Encounter (Signed)
Caller Name: [facility] Call back phone #: [phone]  Reason for Call: Called to inform that they do not take this pts insurance. Please redirect referral.

## 2022-12-22 ENCOUNTER — Encounter: Payer: Self-pay | Admitting: Nurse Practitioner

## 2023-02-13 ENCOUNTER — Telehealth: Payer: Self-pay | Admitting: Family Medicine

## 2023-02-13 NOTE — Telephone Encounter (Signed)
Pt stated that Dr Veto Kemps gave a suggestion about her labs to [facility] to test for Native American issues. They are asking what this means and what should they test for.

## 2023-02-14 NOTE — Telephone Encounter (Signed)
Patient is seeing [facility] for care due to insurance change.  Advised her of Dr Rudd's recommendations and she had no further questions. Dm/cma

## 2023-03-03 ENCOUNTER — Encounter: Payer: Self-pay | Admitting: Nurse Practitioner

## 2023-03-06 ENCOUNTER — Ambulatory Visit (INDEPENDENT_AMBULATORY_CARE_PROVIDER_SITE_OTHER): Payer: BLUE CROSS/BLUE SHIELD | Admitting: Nurse Practitioner

## 2023-03-06 ENCOUNTER — Encounter: Payer: Self-pay | Admitting: Nurse Practitioner

## 2023-03-06 VITALS — BP 112/68 | HR 77 | Ht 61.5 in | Wt 142.0 lb

## 2023-03-06 DIAGNOSIS — Z780 Asymptomatic menopausal state: Secondary | ICD-10-CM | POA: Diagnosis not present

## 2023-03-06 DIAGNOSIS — Z01419 Encounter for gynecological examination (general) (routine) without abnormal findings: Secondary | ICD-10-CM

## 2023-03-06 DIAGNOSIS — N898 Other specified noninflammatory disorders of vagina: Secondary | ICD-10-CM

## 2023-03-06 LAB — WET PREP FOR TRICH, YEAST, CLUE

## 2023-03-06 MED ORDER — FLUCONAZOLE 150 MG PO TABS: 150.0000 mg | ORAL_TABLET | ORAL | 0 refills | Status: AC

## 2023-03-06 NOTE — Progress Notes (Signed)
Tina Stokes Mar 12, 1966 [id number]   History:  57 y.o. [id number] presents for annual exam. Complains of mild vaginal itch that started yesterday. Just finished antibiotics for sinus infection. Postmenopausal - no HRT, no bleeding. Normal pap history. History of anemia and vitamin D deficiency.   Gynecologic History Contraception: post menopausal status and tubal ligation Sexually active: Yes  Health maintenance Last Pap: 02/22/2021. Results were: Normal neg HPV, 5-year repeat Last mammogram: 12/21/2022. Results were: Normal Last colonoscopy: 01/13/2015. Results were: Severe diverticulosis, 10-year recall Last Dexa: Not indicated  Past medical history, past surgical history, family history and social history were all reviewed and documented in the EPIC chart. 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 Son lives here, got married this year.   ROS:  A ROS was performed and pertinent positives and negatives are included.  Exam:  Vitals:   03/06/23 0903  BP: 112/68  Pulse: 77  SpO2: 99%  Weight: 142 lb (64.4 kg)  Height: 5' 1.5" (1.562 m)      Body mass index is 26.4 kg/m.  General appearance:  Normal Thyroid:  Symmetrical, normal in size, without palpable masses or nodularity. Respiratory  Auscultation:  Clear without wheezing or rhonchi Cardiovascular  Auscultation:  Regular rate, without rubs, murmurs or gallops  Edema/varicosities:  Not grossly evident Abdominal  Soft,nontender, without masses, guarding or rebound.  Liver/spleen:  No organomegaly noted  Hernia:  None appreciated  Skin  Inspection:  Grossly normal   Breasts: Examined lying and sitting.   Right: Without masses, retractions, discharge or axillary adenopathy.   Left: Without masses, retractions, discharge or axillary adenopathy. Pelvic: External genitalia:  no lesions              Urethra:  normal appearing urethra with no masses, tenderness or lesions               Bartholins and Skenes: normal                 Vagina: normal appearing vagina with normal color and discharge, no lesions              Cervix: no lesions Bimanual Exam:  Uterus:  no masses or tenderness              Adnexa: no mass, fullness, tenderness              Rectovaginal: Deferred              Anus:  normal, no lesions  Patient informed chaperone available to be present for breast and pelvic exam. Patient has requested no chaperone to be present. Patient has been advised what will be completed during breast and pelvic exam.   Wet prep negative for pathogens  Assessment/Plan:  57 y.o. [id number] for annual exam.   Well female exam with routine gynecological exam - Education provided on SBEs, importance of preventative screenings, current guidelines, high calcium diet, regular exercise, and multivitamin daily. Labs recently with PCP.   Postmenopausal - no HRT, no bleeding.   Well female exam with routine gynecological exam  Postmenopausal  Vaginal itching - Plan: WET PREP FOR [place], YEAST, CLUE, fluconazole (DIFLUCAN) 150 MG tablet today and repeat in 3 days with symptoms persist. Just finished course of antibiotics and prone to yeast in the past with use.   Screening for cervical cancer - Normal Pap history. Will repeat at 5-year interval per guidelines.   Screening for breast cancer - Normal mammogram history.  Continue annual screenings.  Normal breast exam today.  Screening for colon cancer - 2016 colonoscopy. Will repeat at GI's recommended interval.   Follow up in 1 year for annual.     Olivia Mackie [facility], 9:26 AM 03/06/2023

## 2024-03-11 ENCOUNTER — Ambulatory Visit: Payer: BLUE CROSS/BLUE SHIELD | Admitting: Nurse Practitioner

## 2024-05-06 ENCOUNTER — Other Ambulatory Visit: Payer: Self-pay | Admitting: Nurse Practitioner

## 2024-05-06 DIAGNOSIS — N898 Other specified noninflammatory disorders of vagina: Secondary | ICD-10-CM

## 2024-05-06 NOTE — Telephone Encounter (Signed)
 Med refill request: Fluconazole  (DIFLUCAN ) 150 MG tablet  Start:  03/06/23 Disp: 2  tablets Refills:  0  Last AEX: 03/06/23 Next AEX:  Not yet scheduled  *Front desk has been asked to contact Pt to schedule an annual visit.  Last MMG (if hormonal med):  N/A Refill authorized? Please Advise.   As per Pharmacy: DX Code Needed  PATIENT NEEDS REFILL.    Last ordered: 1 year ago (03/06/2023) by Annabella DELENA Shutter, NP   Last refill: 03/06/2023

## 2024-05-09 NOTE — Telephone Encounter (Signed)
 As per [employer]:  [facility] is with [facility] and doesn't need the refill as she is within insurance coverage.
